# Patient Record
Sex: Female | Born: 1935 | Race: White | Hispanic: No | Smoking: Never smoker
Health system: Southern US, Community
[De-identification: ages and names within clinical notes are randomized; demographics above are authoritative.]

## PROBLEM LIST (undated history)

## (undated) DIAGNOSIS — I10 Essential (primary) hypertension: Secondary | ICD-10-CM

## (undated) DIAGNOSIS — I639 Cerebral infarction, unspecified: Secondary | ICD-10-CM

## (undated) DIAGNOSIS — C541 Malignant neoplasm of endometrium: Secondary | ICD-10-CM

## (undated) DIAGNOSIS — E785 Hyperlipidemia, unspecified: Secondary | ICD-10-CM

## (undated) DIAGNOSIS — G629 Polyneuropathy, unspecified: Secondary | ICD-10-CM

## (undated) DIAGNOSIS — E119 Type 2 diabetes mellitus without complications: Secondary | ICD-10-CM

## (undated) DIAGNOSIS — T82897A Other specified complication of cardiac prosthetic devices, implants and grafts, initial encounter: Secondary | ICD-10-CM

## (undated) DIAGNOSIS — H547 Unspecified visual loss: Secondary | ICD-10-CM

## (undated) HISTORY — PX: OTHER SURGICAL HISTORY: SHX169

## (undated) HISTORY — DX: Hyperlipidemia, unspecified: E78.5

## (undated) HISTORY — DX: Malignant neoplasm of endometrium: C54.1

## (undated) HISTORY — DX: Cerebral infarction, unspecified: I63.9

## (undated) HISTORY — DX: Polyneuropathy, unspecified: G62.9

## (undated) HISTORY — DX: Essential (primary) hypertension: I10

## (undated) HISTORY — DX: Unspecified visual loss: H54.7

## (undated) HISTORY — DX: Type 2 diabetes mellitus without complications: E11.9

## (undated) HISTORY — DX: Other specified complication of cardiac prosthetic devices, implants and grafts, initial encounter: T82.897A

---

## 1999-07-20 ENCOUNTER — Other Ambulatory Visit: Admission: RE | Admit: 1999-07-20 | Discharge: 1999-07-20 | Payer: Self-pay | Admitting: Family Medicine

## 2001-05-14 ENCOUNTER — Emergency Department (HOSPITAL_COMMUNITY): Admission: EM | Admit: 2001-05-14 | Discharge: 2001-05-14 | Payer: Self-pay | Admitting: *Deleted

## 2006-08-22 DIAGNOSIS — I639 Cerebral infarction, unspecified: Secondary | ICD-10-CM

## 2006-08-22 HISTORY — DX: Cerebral infarction, unspecified: I63.9

## 2007-06-22 ENCOUNTER — Encounter: Payer: Self-pay | Admitting: Internal Medicine

## 2007-08-13 ENCOUNTER — Ambulatory Visit (HOSPITAL_COMMUNITY): Admission: RE | Admit: 2007-08-13 | Discharge: 2007-08-13 | Payer: Self-pay | Admitting: Interventional Radiology

## 2008-01-03 ENCOUNTER — Ambulatory Visit: Payer: Self-pay | Admitting: Family Medicine

## 2008-01-03 ENCOUNTER — Inpatient Hospital Stay (HOSPITAL_COMMUNITY): Admission: EM | Admit: 2008-01-03 | Discharge: 2008-01-06 | Payer: Self-pay | Admitting: Emergency Medicine

## 2008-01-04 ENCOUNTER — Encounter (INDEPENDENT_AMBULATORY_CARE_PROVIDER_SITE_OTHER): Payer: Self-pay | Admitting: Internal Medicine

## 2008-02-15 ENCOUNTER — Encounter: Payer: Self-pay | Admitting: *Deleted

## 2010-03-05 ENCOUNTER — Inpatient Hospital Stay (HOSPITAL_COMMUNITY): Admission: EM | Admit: 2010-03-05 | Discharge: 2010-03-09 | Payer: Self-pay | Admitting: Emergency Medicine

## 2010-04-26 IMAGING — CT CT HEAD W/O CM
1 series · 16 of 30 positions shown, 20 images · non-contrast
Comparison: 08/13/2007

CLINICAL DATA: Syncope

CT HEAD WITHOUT CONTRAST
TECHNIQUE: Contiguous axial images were obtained from the base of
the skull through the vertex without contrast.

[Series 2: head routine 4.8 h37s · axial · 0.43mm/px · z∈[-125,+10]mm · 16 of 30 slices shown, 20 images]
[im 2/30  brain]
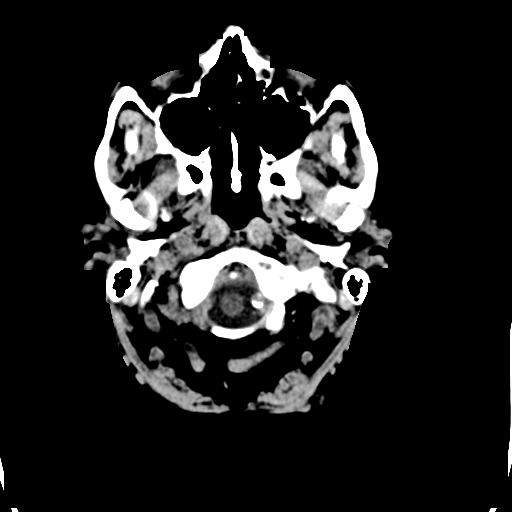
[im 2/30  bone]
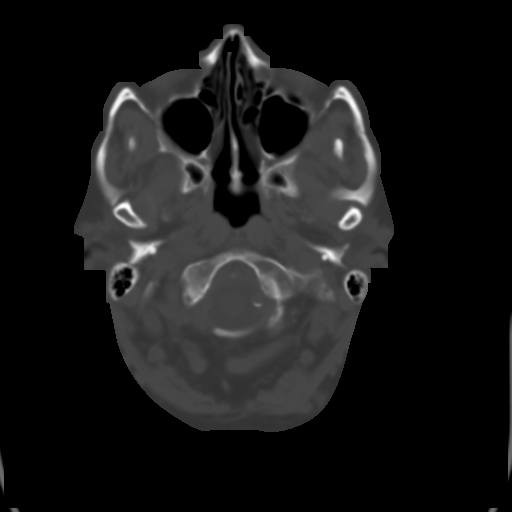
[im 4/30  brain]
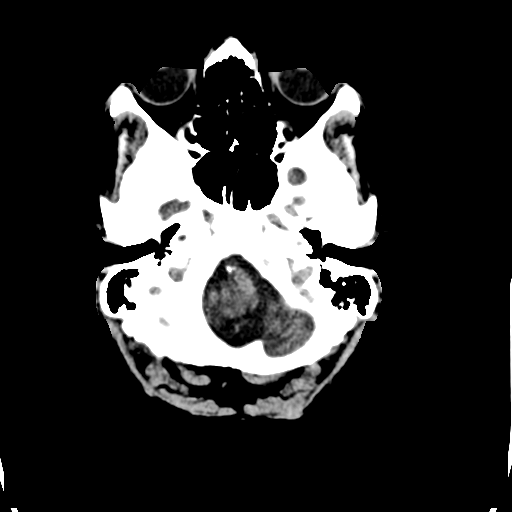
[im 6/30  brain]
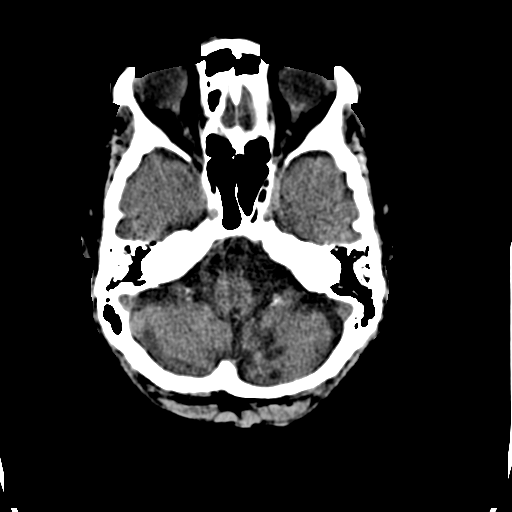
[im 8/30  brain]
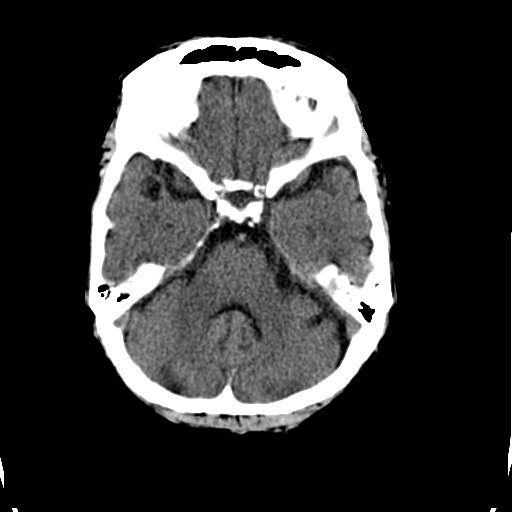
[im 9/30  brain]
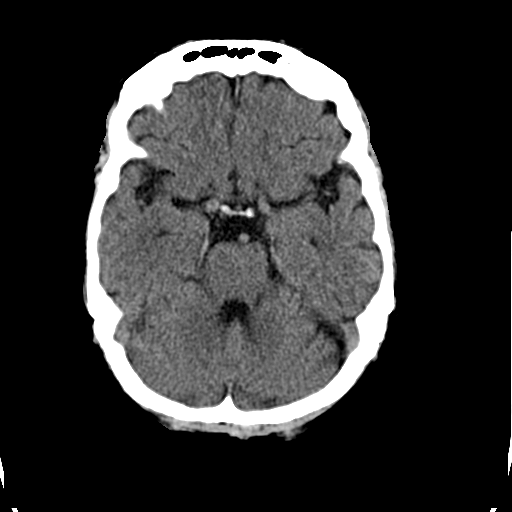
[im 9/30  bone]
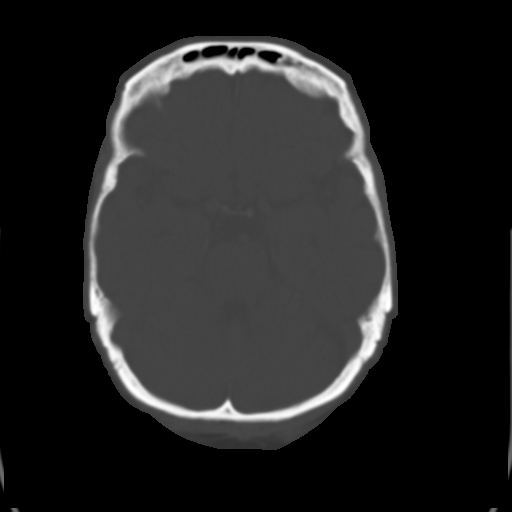
[im 11/30  brain]
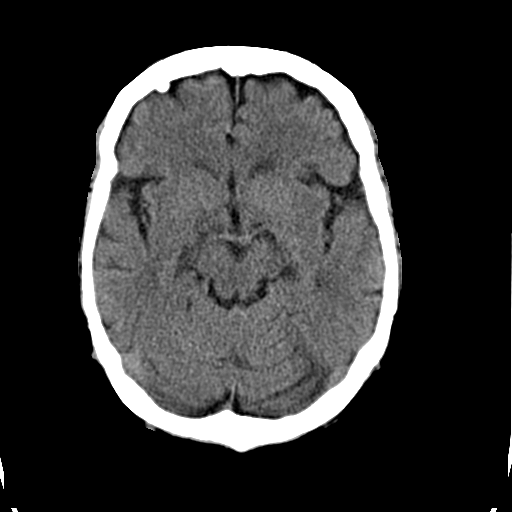
[im 13/30  brain]
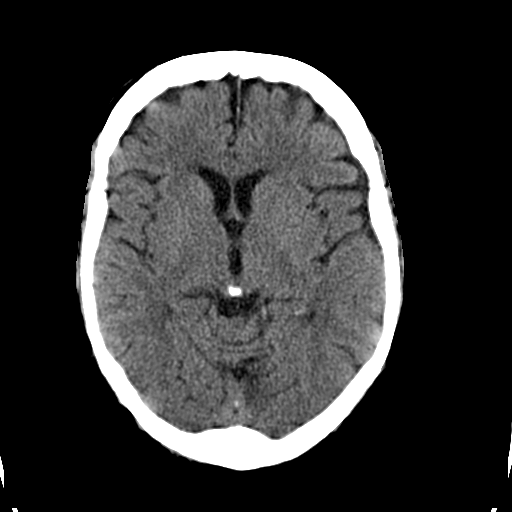
[im 15/30  brain]
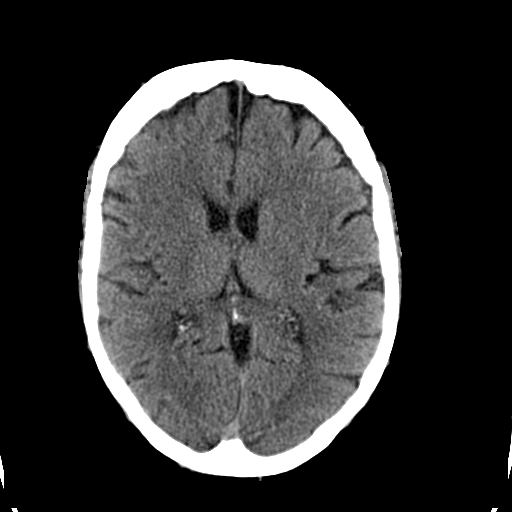
[im 16/30  brain]
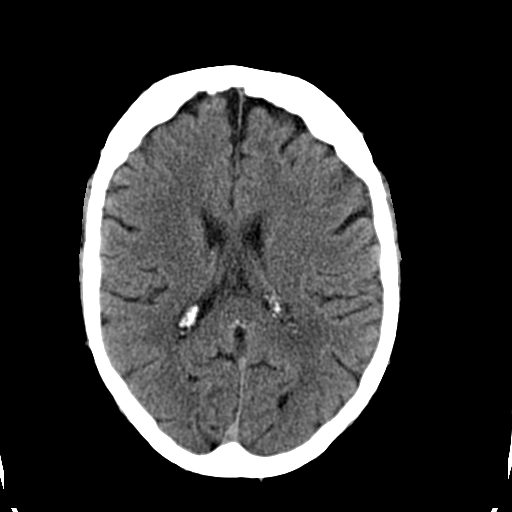
[im 16/30  bone]
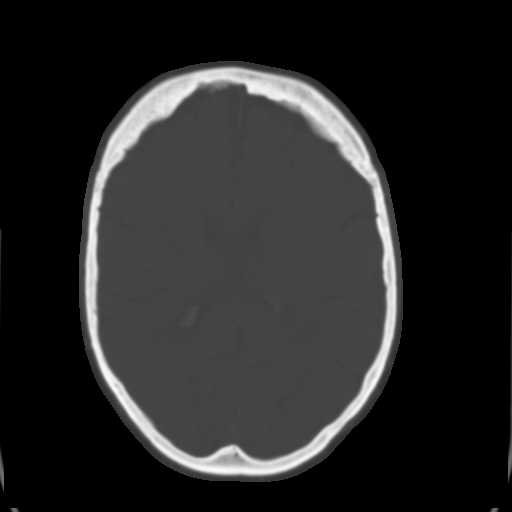
[im 18/30  brain]
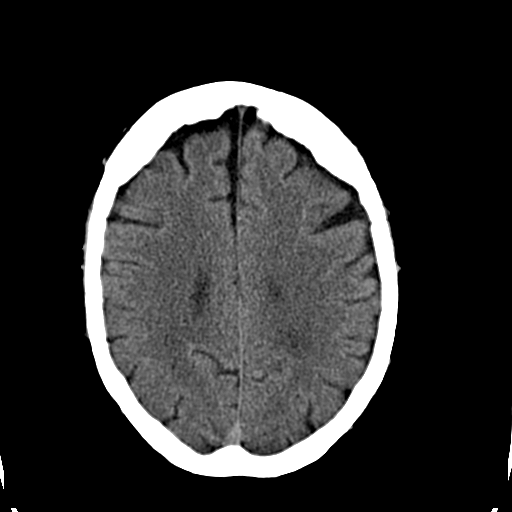
[im 20/30  brain]
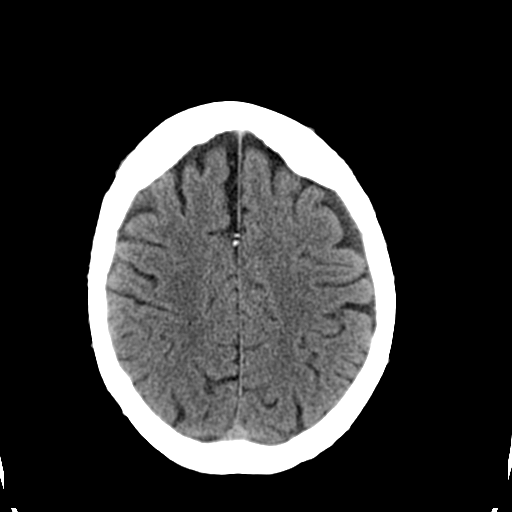
[im 22/30  brain]
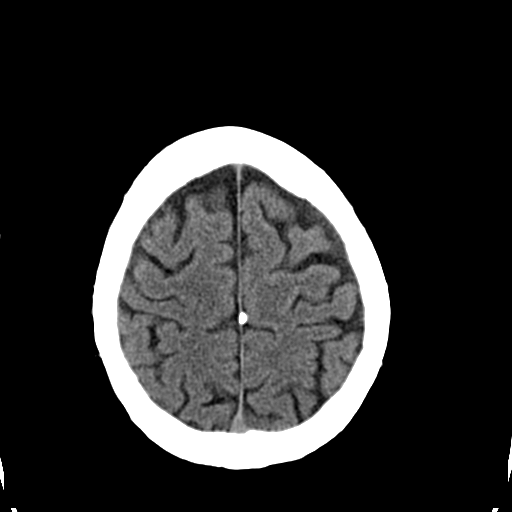
[im 23/30  brain]
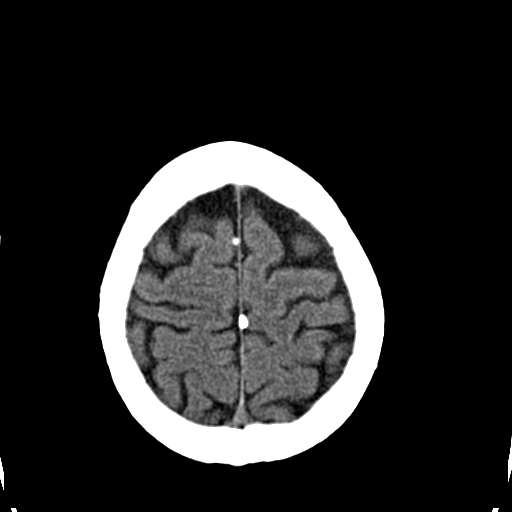
[im 23/30  bone]
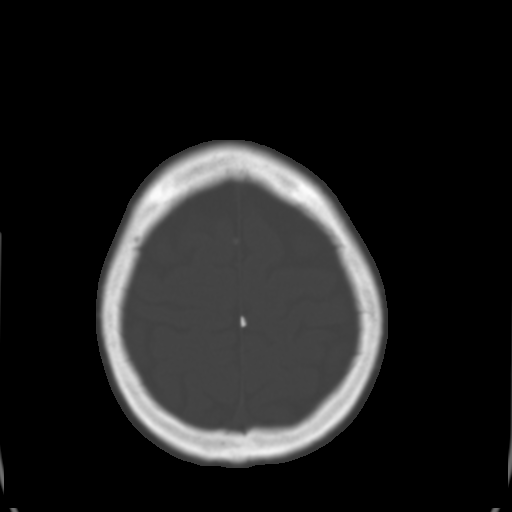
[im 25/30  brain]
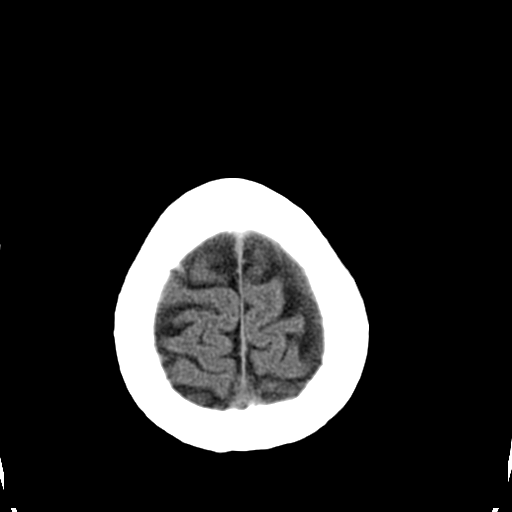
[im 27/30  brain]
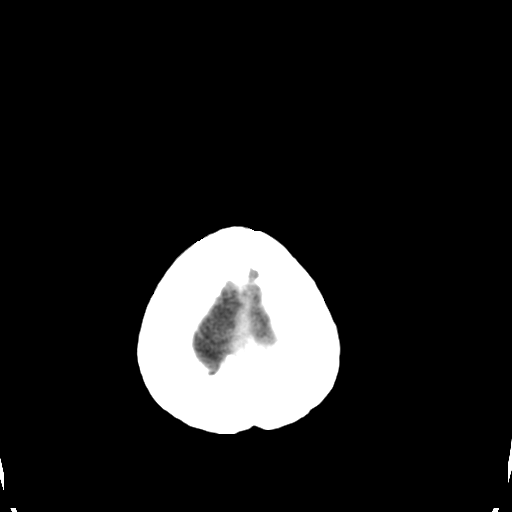
[im 29/30  brain]
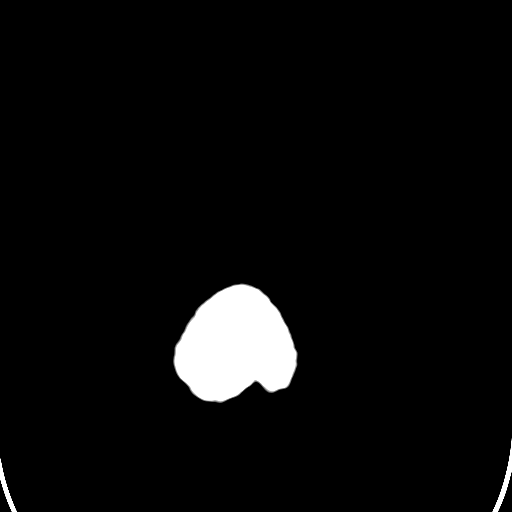

[16 of 30 positions shown; findings below may reference images not displayed]

FINDINGS: The ventricles are normal in size.  There is no acute
infarct or mass lesion.  Mild chronic ischemic changes are present
in the white matter bilaterally.  There is no acute hemorrhage.
There is atherosclerotic calcification in the vertebral arteries
and carotid arteries bilaterally.
IMPRESSION: No acute intracranial abnormality.

## 2010-09-12 ENCOUNTER — Encounter: Payer: Self-pay | Admitting: Interventional Radiology

## 2010-11-06 LAB — GLUCOSE, CAPILLARY
Glucose-Capillary: 102 mg/dL — ABNORMAL HIGH (ref 70–99)
Glucose-Capillary: 102 mg/dL — ABNORMAL HIGH (ref 70–99)
Glucose-Capillary: 107 mg/dL — ABNORMAL HIGH (ref 70–99)
Glucose-Capillary: 109 mg/dL — ABNORMAL HIGH (ref 70–99)
Glucose-Capillary: 110 mg/dL — ABNORMAL HIGH (ref 70–99)
Glucose-Capillary: 116 mg/dL — ABNORMAL HIGH (ref 70–99)
Glucose-Capillary: 118 mg/dL — ABNORMAL HIGH (ref 70–99)
Glucose-Capillary: 119 mg/dL — ABNORMAL HIGH (ref 70–99)
Glucose-Capillary: 126 mg/dL — ABNORMAL HIGH (ref 70–99)
Glucose-Capillary: 129 mg/dL — ABNORMAL HIGH (ref 70–99)
Glucose-Capillary: 160 mg/dL — ABNORMAL HIGH (ref 70–99)
Glucose-Capillary: 170 mg/dL — ABNORMAL HIGH (ref 70–99)

## 2010-11-06 LAB — CARDIAC PANEL(CRET KIN+CKTOT+MB+TROPI)
CK, MB: 1.6 ng/mL (ref 0.3–4.0)
CK, MB: 1.8 ng/mL (ref 0.3–4.0)
Relative Index: INVALID (ref 0.0–2.5)
Relative Index: INVALID (ref 0.0–2.5)
Total CK: 66 U/L (ref 7–177)
Troponin I: 0.01 ng/mL (ref 0.00–0.06)

## 2010-11-06 LAB — BASIC METABOLIC PANEL
BUN: 16 mg/dL (ref 6–23)
Chloride: 103 mEq/L (ref 96–112)
Creatinine, Ser: 0.81 mg/dL (ref 0.4–1.2)
Potassium: 3.8 mEq/L (ref 3.5–5.1)

## 2010-11-07 LAB — HEMOGLOBIN A1C
Hgb A1c MFr Bld: 6.3 % — ABNORMAL HIGH (ref <5.7)
Mean Plasma Glucose: 134 mg/dL — ABNORMAL HIGH (ref <117)

## 2010-11-07 LAB — CK TOTAL AND CKMB (NOT AT ARMC): Relative Index: INVALID (ref 0.0–2.5)

## 2010-11-07 LAB — DIFFERENTIAL
Eosinophils Relative: 0 % (ref 0–5)
Lymphocytes Relative: 18 % (ref 12–46)
Lymphs Abs: 2.2 10*3/uL (ref 0.7–4.0)
Monocytes Absolute: 0.7 10*3/uL (ref 0.1–1.0)
Monocytes Relative: 6 % (ref 3–12)

## 2010-11-07 LAB — CBC
HCT: 42.7 % (ref 36.0–46.0)
Hemoglobin: 14.5 g/dL (ref 12.0–15.0)
MCH: 32 pg (ref 26.0–34.0)
MCHC: 34 g/dL (ref 30.0–36.0)
MCV: 94.2 fL (ref 78.0–100.0)
Platelets: 333 10*3/uL (ref 150–400)
WBC: 12.5 10*3/uL — ABNORMAL HIGH (ref 4.0–10.5)

## 2010-11-07 LAB — URINALYSIS, ROUTINE W REFLEX MICROSCOPIC
Glucose, UA: 100 mg/dL — AB
Hgb urine dipstick: NEGATIVE
Protein, ur: NEGATIVE mg/dL
pH: 7 (ref 5.0–8.0)

## 2010-11-07 LAB — POCT CARDIAC MARKERS
CKMB, poc: 1.5 ng/mL (ref 1.0–8.0)
Myoglobin, poc: 60 ng/mL (ref 12–200)
Troponin i, poc: <0.05 ng/mL (ref 0.00–0.09)

## 2010-11-07 LAB — COMPREHENSIVE METABOLIC PANEL
AST: 22 U/L (ref 0–37)
Albumin: 4.2 g/dL (ref 3.5–5.2)
Calcium: 9 mg/dL (ref 8.4–10.5)
Creatinine, Ser: 0.61 mg/dL (ref 0.4–1.2)
GFR calc Af Amer: >60 mL/min (ref >60)
Sodium: 138 mEq/L (ref 135–145)

## 2010-11-07 LAB — TROPONIN I: Troponin I: 0.02 ng/mL (ref 0.00–0.06)

## 2010-11-07 LAB — TSH: TSH: 1.726 u[IU]/mL (ref 0.350–4.500)

## 2011-01-04 NOTE — H&P (Signed)
[name], [name] NO.:  [id number]   MEDICAL RECORD NO.:  [id number]          PATIENT TYPE:  INP   LOCATION:  4707                         FACILITY:  [facility]   PHYSICIAN:  [name] Kim, M.D.DATE OF BIRTH:  March 06, 1929   DATE OF ADMISSION:  01/03/2008  DATE OF DISCHARGE:                              HISTORY & PHYSICAL   PRIMARY CARE PHYSICIAN:  [name] Kim in [place].   CHIEF COMPLAINT:  Syncope.   HISTORY OF PRESENT ILLNESS:  The patient is a 77 year old female who was  brought to the emergency department by EMS after a syncopal episode.  The patient states (history corroborated by grandson who was with her)  that she ate taco bar earlier this afternoon, then went to [employer].  She was there for 45 minutes. While at the checkout line, she felt  nauseous and was sweating, so she went to sit down and then she passed  out.  Of note, her eyes were open, and per witnesses, she had some  tremors for about 15 to 20 minutes.  She appeared cold and pale.  She  states she had some dyspnea but denies chest pain or palpitations.  No  loss of bowel or bladder function.  She had a stress test in December  2008 at [facility], which was normal.  She  had previous strokes noted on CT scan done in 2008 in the left inferior  cerebellar region, and she was also diagnosed with bilateral vertebral  artery stenosis in December 2008.  Full records and history are not in E-  chart as patient's PCP is in [place].  She also had an angiogram in  December 2008, which showed complete right vertebral artery occlusion,  80-85% narrowing of left vertebral basilar junction and 50% right ICA  stenosis.  She has not seen [name] Kim since this visit though she  was told to follow up in 3 months.   PAST MEDICAL HISTORY:  1. Borderline diabetes mellitus.  2. Hypertension.  3. CVA.  4. Hyperlipidemia.  5. Endometrial cancer, status post TAH-BSO in 2006.  6. Vertebral artery stenosis as noted above.   ALLERGIES:  No known drug allergies.   MEDICATIONS:  1. Altace 10 mg daily.  2. Omeprazole 20 mg daily.  3. Folcaps 2.2 mg daily.  4. Hydrochlorothiazide 25 mg daily.  5. Glimepiride 2 mg morning.  6. Metoprolol 50 mg b.i.d.  7. Norvasc 10 mg daily.  8. Lipitor 10 mg daily.  9. TriCor 145 mg daily.  10.Plavix 75 mg daily.  11.Aspirin 81 mg daily.  12.Tylenol 650 mg q.6 h.  13.Valium 2 mg nightly.  14.Lexapro 10 mg daily.   SOCIAL HISTORY:  She lives with her son and daughter-in-law.  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  She is widowed.  She denies any tobacco, alcohol, or drug use currently or remote.   FAMILY HISTORY:  Mother is deceased from cancer.  Father is deceased at  age 54.  He had drowned.   REVIEW OF SYSTEMS:  Positive for some  diaphoresis, nausea, fatigue,  headache, cough since October, right leg burning, and inability to see  out of her right eye, this is not new.  Denies fevers, chills, chest  pain, edema, orthopnea, vomiting, diarrhea, bright red blood per rectum,  melena, abdominal pain, dysuria, hematuria, rash, visual changes,  otherwise, dysarthria, weakness, and numbness.   PHYSICAL EXAMINATION:  VITAL SIGNS:  Temperature 96.8, pulse 60,  respirations 18, blood pressure 158/74, and pulse oximetry 95% on room  air.  GENERAL:  No acute distress, pleasant.  HEENT:  Atraumatic and normocephalic.  Extraocular movements intact.  Pupils equal, round, and reactive to light.  No nasal discharge.  Tympanic membranes are glossy with a good light reflex and canals are  clear.  Mucous membranes are moist without pharyngeal erythema or  exudate.  NECK:  No lymphadenopathy, JVD, thyromegaly, or bruits.  CARDIOVASCULAR:  Regular rate and rhythm.  No murmurs, rubs, or gallops.  LUNGS:  Clear to auscultation bilaterally.  No wheezes, rales, or  rhonchi.  ABDOMEN:  Soft, nontender, and  nondistended.  No hepatosplenomegaly.  Bowel sounds positive.  EXTREMITIES:  No cyanosis or edema, 2+ DP pulses.  NEUROLOGIC:  Alert and oriented x3.  Cranial nerves II-XII are grossly  intact except for decreased vision in right eye and some difficulty  hearing bilaterally.  Strength is 5/5 in all extremities.  Sensation is  intact to light touch bilaterally.  Muscle stretch reflexes are equal  bilaterally.  Mild dysmetria finger-to-nose bilaterally.  No  dysdiadochokinesis and heel-to-shin is normal bilaterally.   LABS AND STUDIES:  Hemoglobin 12.7, platelets 453, white blood cell  count 11.0 with 70% neutrophils.  Sodium 139, potassium 4.6, chloride  100, bicarb 25, BUN 14, creatinine 0.71, glucose 140, calcium 9.4,  protein 7.5, albumin 4.0, AST 837, INR 1.0, and PT 13.7.  Urinalysis;  specific gravity is 1.015, nitrite negative, small leukocyte esterase.  Urine microscopy showed few epithelial cells and many bacteria.  Myoglobin 56.2, CK-MB 1.5, troponin-I less than 0.05.  Head CT showed no  acute abnormality.  EKG is normal sinus with the left axis.  Poor R-wave  progression otherwise no concordant ST-T or Q-wave changes.  PR and QT  intervals are normal.   ASSESSMENT/PLAN:  This is a 77 year old female with:  1. Syncope, vasovagal versus carotid stenosis versus transient      ischemic attack versus arrhythmia/cardiac event versus valvular      dysfunction versus pulmonary embolus.  Given her known vertebral      artery disease, this is possible but typically not classic      symptomatology for this.  We will check carotid Dopplers to assess      for increased carotid disease since angiogram in July 23, 1997.      We will place on telemetry to monitor for any arrhythmias.  We will      check a 2D echo to assess structure and valvular function of the      heart.  We will check a TSH.  We will check an EKG in the morning.      We will check a D-dimer given she is low risk for  pulmonary embolus      but had dyspnea.  She is not hypoxic or tachycardiac.  Vasovagal is      also possible given her symptoms of nausea and diaphoresis, which      was postprandial.  We will have neuro checks every 4 hours along  with fall precautions.  Head CT is negative, but transient ischemic      attack is possible.  We will consider EEG, but the patient is not      postictal and had very quick recovery so I doubt that this was a      seizure.  2. Hypertension.  The patient is on several medications for      hypertension including Altace, hydrochlorothiazide, metoprolol, and      Norvasc.  We will hold the patient's beta blocker as this may      precipitate falls in patient.  3. Hyperlipidemia.  We will check a fasting lipid panel and TSH.  We      will continue the patient's Lipitor and TriCor per her home doses.  4. Cough.  She said this since October and now it is dry.  She likely      has allergic symptoms.  We will check a chest x-ray.  5. Diabetes mellitus.  We will check an A1c.  We will place on      moderate sliding scale insulin q.a.c.  We will check CBG q.a.c. and      nightly.  We will place her on a carbohydrate modified diet.  6. Leukocytosis.  We will check a urine culture and monitor for      fevers.  If she has any symptoms of fever, we will start      antibiotics but would not be pressed to start antibiotics for      asymptomatic bacteriuria.  7. History of cerebrovascular accident .  We will consider calling Dr.      Corliss Kim to reassess her vertebral stenosis.  We will continue her      on aspirin and Plavix as at home.  We will discontinue the      patient's omeprazole as this may decrease effectiveness of Plavix.  8. Prophylaxis, ambulation.      Norton Blizzard, M.D.  Electronically Signed      [name] Kim, M.D.  Electronically Signed    SH/MEDQ  D:  01/04/2008  T:  01/04/2008  Job:  [id number]

## 2011-01-04 NOTE — Consult Note (Signed)
[name] Kim, [name] Kim                ACCOUNT NO.:  [id number]   MEDICAL RECORD NO.:  [id number]          PATIENT TYPE:  OUT   LOCATION:  XRAY                         FACILITY:  [facility]   PHYSICIAN:  Sanjeev K. Deveshwar, M.D.DATE OF BIRTH:  September 04, 1928   DATE OF CONSULTATION:  02/15/2008  DATE OF DISCHARGE:  01/06/2008                                 CONSULTATION   CHIEF COMPLAINT:  Cerebrovascular disease.   HISTORY OF PRESENT ILLNESS:  This is a very pleasant 77 year old female  who was recently admitted to [facility] on Jan 03, 2008 to Jan 06, 2008 after she had a syncopal episode while in [employer].  The  etiology of the episode was unclear.  The patient had previously  undergone a cerebral angiogram performed on August 13, 2007 by Dr.  Corliss Skains to evaluate sudden onset of blindness in her right eye.  She  was found to have an occluded right vertebral artery with an 80%-85%  left vertebrobasilar junction.  She was also noted to have a 50% right  internal carotid artery stenosis.  At that time, the patient's arteries  were noted to be very tortuous and PTA stenting was not performed.  The  patient was felt to be asymptomatic and continued aspirin and Plavix  therapy was recommended with followup in 3 months.  The patient  apparently was lost to followup until she was admitted to [facility] recently.  After discharge, she was told to contact Dr.  Corliss Skains for a followup visit.  She presents today accompanied by her  son for that visit.  The patient reports that she has had no further  syncopal episodes.  She has had some generalized weakness.  She remains  weak on her right side from a CVA in September 2008.   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  She has also had a negative  stress nuclear stress test in November 2008.  Her ejection fraction was  75% at that time.   PAST SURGICAL HISTORY:  The patient is status post tonsillectomy, status  post appendectomy, status post total abdominal hysterectomy with BSO in  2006.   ALLERGIES:  CONTRAST DYE causes nausea and vomiting.   CURRENT MEDICATIONS:  The patient did not bring a medication list to her  visit today.  She believes that her medications have not significantly  changed from her discharge from [facility] in May.  At that  time, her medications included:  1. Altace 10 mg daily.  2. Folcaps 2.2 mg daily.  3. Hydrochlorothiazide 25 mg daily.  4. Amaryl 2 mg daily.  5. Norvasc 10 mg daily.  6. Tricor 145 mg daily.  7. Plavix 75 mg daily.  8. Aspirin 81 mg daily.  9. Valium 2 mg at bedtime p.r.n.  10.Lipitor 20 mg daily.  11.Tylenol 650 mg every 6 hours  as needed.  12.Lexapro 10 mg daily.   Apparently, she had been on a proton pump inhibitor that was  discontinued over concerns for a potential interaction with Plavix.   SOCIAL HISTORY:  The patient is widowed.  She has one son who is living.  She has one son who died from an MI.  The patient lives in [place] with  her son and daughter-in-law.  She does not smoke or use alcohol.  She  was a housewife.   FAMILY HISTORY:  Her mother died at age 51 from cancer.  Her father died  from drowning at age 20.  She has 3 brothers who are deceased.  She has  4 sisters alive and well.   IMPRESSION AND PLAN:  As noted, this patient returns today accompanied  by her son to be seen in followup by Dr. Corliss Skains.  She had undergone a  cerebral angiogram in December 2008 and then was apparently lost to  followup until she was readmitted to [facility] in May with a  syncopal episode of uncertain etiology.  Her previous angiogram had  shown an occluded right vertebral  artery with an 80%-85% stenosis of the  left vertebrobasilar junction as well as a 50% right internal carotid  artery stenosis.  The patient has remained on Plavix and aspirin and has  been asymptomatic.  Other than her recent syncopal episode, she did have  a stroke in September 2008 and has residual weakness as a result of her  stroke.  She is able to ambulate household distances with a quad cane.  She occasionally has episodes where she feels weak and has to sit down  to regain her strength but then she is able to get up and go again.   The etiology of her syncopal episode in May 2009 still remains unclear.  Dr. Corliss Skains did review the results of her previous MRI, MRA as well as  her cerebral angiogram.  He did not feel strongly that an intervention  should be re-attempted at this time.  He did recommend repeating an MRI,  MRA in 6 months.  The patient was told to call in the interim if she had  any new neurologic-type symptoms.  Greater than 25 minutes were spent on  this followup visit.      Delton See, P.A.    ______________________________  Grandville Silos. Corliss Skains, M.D.    DR/MEDQ  D:  02/15/2008  T:  02/16/2008  Job:  [id number]   cc:   Riley Lam M.D. Charlean Sanfilippo, M.D.

## 2011-01-04 NOTE — Discharge Summary (Signed)
[name] Kim, [name] Kim NO.:  [id number]   MEDICAL RECORD NO.:  [id number]          PATIENT TYPE:  INP   LOCATION:  4707                         FACILITY:  [facility]   PHYSICIAN:  Pearlean Brownie, M.D.DATE OF BIRTH:  1929-06-17   DATE OF ADMISSION:  01/03/2008  DATE OF DISCHARGE:  01/06/2008                               DISCHARGE SUMMARY   DISCHARGE DIAGNOSES:  1. Idiopathic syncope.  2. History of peripheral vascular disease with significant vertebral      blockages and a 50% right ICA in December 2008.  3. Hypertension.  4. Diabetes mellitus.  5. Questionable mild aortic stenosis.  6. Depression/anxiety.  7. History of endometrial cancer status post total abdominal      hysterectomy and bilateral salpingo-oophorectomy in 2006.  8. History of hyperlipidemia.   DISCHARGE MEDICATIONS:  1. Altace 10 mg daily.  2. Folcaps 2.2 mg daily.  3. Hydrochlorothiazide 25 mg daily.  4. Amaryl 2 mg daily.  5. Norvasc 10 mg daily.  6. Tricor 145 mg daily.  7. Plavix 75 mg daily.  8. Aspirin 81 mg daily.  9. Valium 2 mg before bedtime time as needed.  10.Lipitor 20 mg daily.  11.Tylenol 650 mg every 6 hours as needed.  12.Lexapro 10 mg daily.   PROCEDURES:  1. Chest x-ray on Jan 03, 2008, showed cardiomegaly but no acute      abnormalities.  2. Head CT on Jan 03, 2008, showed no acute intracranial abnormality,      but obvious atherosclerotic calcification of the vertebral arteries      and carotids bilaterally with chronic ischemic changes in the white      matter.  3. Echocardiogram:  Systolic function vigorous.  No regional wall      abnormalities with focal basal septal hypertrophy with evidence for      dynamic left ventricular outflow tract obstruction at rest.      Consider TEE as we cannot exclude a mild aortic stenosis, mild      mitral regurgitation, mild left atrial dilation, hyperdynamic right      ventricular systolic function with mild right  ventricular      hypertrophy.   PERTINENT LABORATORY RESULTS:  Creatinine at discharge was 0.67.  Hemoglobin A1c was 5.9.  TSH was 2.078.  Cardiac panels were negative.  D-dimer was negative.  Lipid profile included HDL 40 and LDL of 90.  Blood count at admission was 12.7.  Her hemoglobin was 12.2 at  discharge.   HOSPITAL COURSE:  A 77 year old white female admitted after a syncopal  event.  There was no obvious cause for this at the time in the emergency  department.  Echocardiogram was performed, which showed possible aortic  stenosis, otherwise no enlarged abnormalities.  See procedures above.  The patient did well without any repeat events throughout her hospital  stay essentially.  She was ambulating at the time of discharge.  Her  telemetry has been cleared.   DIAGNOSES:  1. Syncope:  Idiopathic at this time.  Possibilities include      contribution from her Valium, although she  was stable at hospital      on her home dose without problems.  Peripheral vascular disease is      not a possibility, although syncope is an uncommon complaint, her      presenting complaint for this issue.  2. Peripheral artery disease:  The patient will follow up with Dr.      Corliss Skains to evaluate for consideration of change of plan given her      change in symptoms.  She has had fairly recent intracranial imaging      back in December.  So, I am not sure if she would necessarily be      due for further angiogram.  She is also currently on aspirin and      Plavix, and I wonder if the Plavix alone could give her the benefit      she needs since there is increased risk of bleeding with the      combination therapy and to my understanding no added benefit from      the aspirin.  3. Diabetes mellitus:  She is stable, continue.  4. Abnormalities on echocardiogram:  The patient will follow up with      Dr. Lynnea Ferrier at [facility], although per the      nurse, there will no specific  intervention required for these      issues.  They will decide on the need for TEE.  5. I would consider Doppler ultrasounds of the carotids in the      outpatient setting.   DISCHARGE DIET:  Low sodium.   DISCHARGE ACTIVITY:  Carefully increase activity as tolerated.   FOLLOWUP:  The patient will call to see Dr. Tomasa Blase, Dr. Corliss Skains, and  Dr. Lynnea Ferrier individually so that she can be seen by each of these  physicians in the next 1-2 weeks.  Of note, Prilosec was discontinued  secondary to its potential interaction with Plavix and the metoprolol  was discontinued as it could potentially have contributed to her  syncope.      Angeline Slim, M.D.  Electronically Signed      Pearlean Brownie, M.D.  Electronically Signed    AL/MEDQ  D:  01/06/2008  T:  01/06/2008  Job:  [id number]   cc:   Corliss Skains, MD  Tomasa Blase, MD  Ritta Slot, MD

## 2011-01-28 ENCOUNTER — Emergency Department (HOSPITAL_COMMUNITY): Payer: Medicare Other

## 2011-01-28 ENCOUNTER — Inpatient Hospital Stay (HOSPITAL_COMMUNITY)
Admission: EM | Admit: 2011-01-28 | Discharge: 2011-01-30 | DRG: 093 | Disposition: A | Discharge status: Home Health | Payer: Medicare Other | Attending: Emergency Medicine | Admitting: Emergency Medicine

## 2011-01-28 DIAGNOSIS — R279 Unspecified lack of coordination: Principal | ICD-10-CM | POA: Diagnosis present

## 2011-01-28 DIAGNOSIS — G609 Hereditary and idiopathic neuropathy, unspecified: Secondary | ICD-10-CM | POA: Diagnosis present

## 2011-01-28 DIAGNOSIS — E119 Type 2 diabetes mellitus without complications: Secondary | ICD-10-CM | POA: Diagnosis present

## 2011-01-28 DIAGNOSIS — Z7902 Long term (current) use of antithrombotics/antiplatelets: Secondary | ICD-10-CM

## 2011-01-28 DIAGNOSIS — I10 Essential (primary) hypertension: Secondary | ICD-10-CM | POA: Diagnosis present

## 2011-01-28 DIAGNOSIS — H5440 Blindness, one eye, unspecified eye: Secondary | ICD-10-CM | POA: Diagnosis present

## 2011-01-28 DIAGNOSIS — J029 Acute pharyngitis, unspecified: Secondary | ICD-10-CM | POA: Diagnosis present

## 2011-01-28 DIAGNOSIS — Z8673 Personal history of transient ischemic attack (TIA), and cerebral infarction without residual deficits: Secondary | ICD-10-CM

## 2011-01-28 DIAGNOSIS — Z7982 Long term (current) use of aspirin: Secondary | ICD-10-CM

## 2011-01-28 DIAGNOSIS — I6509 Occlusion and stenosis of unspecified vertebral artery: Secondary | ICD-10-CM | POA: Diagnosis present

## 2011-01-28 DIAGNOSIS — Z8542 Personal history of malignant neoplasm of other parts of uterus: Secondary | ICD-10-CM

## 2011-01-28 DIAGNOSIS — R110 Nausea: Secondary | ICD-10-CM | POA: Diagnosis present

## 2011-01-28 DIAGNOSIS — E785 Hyperlipidemia, unspecified: Secondary | ICD-10-CM | POA: Diagnosis present

## 2011-01-28 LAB — DIFFERENTIAL
Lymphs Abs: 2.8 10*3/uL (ref 0.7–4.0)
Monocytes Absolute: 1 10*3/uL (ref 0.1–1.0)
Monocytes Relative: 8 % (ref 3–12)
Neutro Abs: 7.7 10*3/uL (ref 1.7–7.7)
Neutrophils Relative %: 66 % (ref 43–77)

## 2011-01-28 LAB — CBC
HCT: 42.7 % (ref 36.0–46.0)
Hemoglobin: 15.5 g/dL — ABNORMAL HIGH (ref 12.0–15.0)
MCH: 32.2 pg (ref 26.0–34.0)
MCV: 88.6 fL (ref 78.0–100.0)
RBC: 4.82 MIL/uL (ref 3.87–5.11)

## 2011-01-28 LAB — COMPREHENSIVE METABOLIC PANEL
AST: 19 U/L (ref 0–37)
Albumin: 4.2 g/dL (ref 3.5–5.2)
BUN: 10 mg/dL (ref 6–23)
Creatinine, Ser: 0.5 mg/dL (ref 0.4–1.2)
Potassium: 3.3 mEq/L — ABNORMAL LOW (ref 3.5–5.1)
Total Protein: 7.7 g/dL (ref 6.0–8.3)

## 2011-01-28 LAB — URINALYSIS, ROUTINE W REFLEX MICROSCOPIC
Bilirubin Urine: NEGATIVE
Glucose, UA: NEGATIVE mg/dL
Hgb urine dipstick: NEGATIVE
Ketones, ur: NEGATIVE mg/dL
pH: 7.5 (ref 5.0–8.0)

## 2011-01-28 LAB — APTT: aPTT: 38 seconds — ABNORMAL HIGH (ref 24–37)

## 2011-01-28 LAB — CK TOTAL AND CKMB (NOT AT ARMC)
CK, MB: 1.7 ng/mL (ref 0.3–4.0)
Relative Index: INVALID (ref 0.0–2.5)

## 2011-01-28 LAB — PROTIME-INR
INR: 1.11 (ref 0.00–1.49)
Prothrombin Time: 14.5 seconds (ref 11.6–15.2)

## 2011-01-28 LAB — LIPASE, BLOOD: Lipase: 22 U/L (ref 11–59)

## 2011-01-28 LAB — TROPONIN I: Troponin I: <0.3 ng/mL (ref <0.30)

## 2011-01-29 ENCOUNTER — Inpatient Hospital Stay (HOSPITAL_COMMUNITY): Payer: Medicare Other

## 2011-01-29 LAB — GLUCOSE, CAPILLARY
Glucose-Capillary: 107 mg/dL — ABNORMAL HIGH (ref 70–99)
Glucose-Capillary: 139 mg/dL — ABNORMAL HIGH (ref 70–99)
Glucose-Capillary: 116 mg/dL — ABNORMAL HIGH (ref 70–99)

## 2011-01-29 LAB — CARDIAC PANEL(CRET KIN+CKTOT+MB+TROPI)
CK, MB: 1.8 ng/mL (ref 0.3–4.0)
Total CK: 56 U/L (ref 7–177)

## 2011-01-29 LAB — LIPID PANEL
Cholesterol: 189 mg/dL (ref 0–200)
Total CHOL/HDL Ratio: 3.3 RATIO
VLDL: 22 mg/dL (ref 0–40)

## 2011-01-29 LAB — HEMOGLOBIN A1C: Hgb A1c MFr Bld: 6.2 % — ABNORMAL HIGH (ref <5.7)

## 2011-01-30 LAB — GLUCOSE, CAPILLARY: Glucose-Capillary: 96 mg/dL (ref 70–99)

## 2011-02-03 NOTE — Discharge Summary (Signed)
[name] Kim, [name] Kim NO.:  [id number]  MEDICAL RECORD NO.:  [id number]  LOCATION:  3739                         FACILITY:  [facility]  PHYSICIAN:  Jeoffrey Massed, MD    DATE OF BIRTH:  06-Aug-1929  DATE OF ADMISSION:  01/28/2011 DATE OF DISCHARGE:                        DISCHARGE SUMMARY - REFERRING   PRIMARY CARE PRACTITIONER:  Mickey Farber, MD in [place].  PRIMARY DISCHARGE DIAGNOSIS:  Transient weakness and ataxia, now resolved.  SECONDARY DISCHARGE DIAGNOSES: 1. Hypertension. 2. Diabetes. 3. Dyslipidemia. 4. History of CVA in 2008. 5. History of endometrial cancer, status post hysterectomy. 6. History of chronically occluded right vertebral artery and possibly     chronic cerebellar syndrome from this. 7. History of blindness in her right eye. 8. Peripheral neuropathy. 9. Type 2 diabetes.  DISCHARGE MEDICATIONS: 1. Lisinopril 20 mg 1 tablet daily. 2. Crestor 10 mg 1 tablet at bedtime. 3. Artificial tears 2 drops in both eyes twice daily p.r.n. 4. Aspirin 81 mg 1 tablet daily. 5. Fish oil 100 mg 2 capsules daily. 6. Folplex 2.2 one tablet p.o. daily. 7. Amaryl 4 mg 1 tablet daily. 8. Omeprazole 20 mg 1 tablet daily. 9. Plavix 75 mg 1 tablet daily at bedtime. 10.Tylenol 650 mg 1 tablet p.o. q.6 h p.r.n. 11.Vicodin 5/500 one tablet p.o. q.6 h. p.r.n. 12.Vitamin D3 1000 units 2 tablets p.o. daily. 13.Neurontin 100 mg 1 tablet p.o. 3 times a day.  CONSULTATIONS:  Dr. Thad Ranger from Neurology.  BRIEF HISTORY OF PRESENT ILLNESS:  The patient is a 77 year old female with a known history of high-grade right vertebral artery stenosis and chronic cerebral syndrome, at baseline walks with a walker, presented with an unsteady gait, nausea, and issues with a blood pressure.  For further details, please see the history and physical that was dictated by Dr. Toniann Fail on admission.  PERTINENT RADIOLOGICAL STUDIES: 1. MRI of the brain showed small-vessel  disease.  Signal loss in the     right vertebral artery compatible with known stenosis.  Remote     encephalomalacia in the inferior left cerebellum.  Atrophy and     extensive white matter disease without significant change. 2. MRA of the brain shows small-vessel disease.  Krista Blue loss of the     right vertebral artery compatible with known stenosis.  Stable     ectasia of the proximal intracranial left vertebral artery. 3. Carotid duplex preliminary showed no significant ICA stenosis.  BRIEF HOSPITAL COURSE: 1. Ataxia.  Patient has had chronic intermittent problems with this     issue and at baseline walks with a walker.  Since this was much     worse than her usual baseline, she presented to the ED.  A CT of     the head done in the emergency room was negative for any     significant acute changes.  She was admitted to the telemetry unit     and subsequently Neurology was consulted.  MRI and MRA of the brain     was ordered which is noted as above, but in short did not show any     acute stroke.  Her known right vertebral stenosis was unchanged.  Interestingly, her symptoms resolved with supportive measures.     Today, she claims that she is back to her usual baseline.  She was     evaluated by Physical Therapy, and they have recommended home     health PT.  The carotid Doppler was negative for any significant     ICA stenosis.  A 2-D echocardiogram has been done, but it is     currently pending.  I had discussed this case personally with Dr.     Thad Ranger this morning, and she has actually cleared this patient     for discharge from her point of view as well.  She will follow up     with her primary care practitioner for further continued care. 2. Hypertension.  The patient has a known history of hypertension and     actually was on 4 medications prior to admission here.  Apparently,     there have been issues with her blood pressure being high a few     days prior to admission.   There are few issues with her blood     pressure being high prior to this admission, and apparently     medications have been added recently by her primary care     practitioner.  However, upon our evaluation throughout the hospital     course here, her blood pressure without any antihypertensive     medications has been in the 120s to 140s systolic.  At this point,     I think some of the dizziness and unsteady gait may have been     precipitated by decreased perfusion as well.  So, at this point, I     am only discharging her on lisinopril.  Her other medications which     include hydrochlorothiazide, amlodipine, and losartan have now been     discontinued.  I have instructed the patient and I have also spoken     with the patient's son over the phone and have asked them to follow     up with her primary care practitioner within 1 week upon discharge     for further continued optimization of her blood pressure     medications. 3. Dyslipidemia.  The patient has been started on Crestor. 4. Diabetes.  The patient is to continue Amaryl. 5. Peripheral neuropathy.  She is to continue taking Neurontin. 6. Known right vertebral artery stenosis.  The patient is to continue     taking aspirin, Plavix, and simvastatin.  DISPOSITION:  It is felt that the patient has met maximal benefit from inpatient hospital stay, her dizziness and ataxic gait have all resolved, and she is back to her baseline, has been discharged home with home health physical therapy.  FOLLOW-UP INSTRUCTIONS:  The patient will follow up with her primary care practitioner, Dr. Sedalia Muta within 1 week upon discharge.  She is to call and make an appointment.  Total time spent coordinating discharge equals 55 minutes.     Jeoffrey Massed, MD     SG/MEDQ  D:  01/30/2011  T:  01/30/2011  Job:  [id number]  cc:   Blane Ohara, MD  Electronically Signed by Jeoffrey Massed  on 02/03/2011 07:35:57 PM

## 2011-02-14 NOTE — H&P (Signed)
NAMECATHLIN, BUCHAN NO.:  [id number]  MEDICAL RECORD NO.:  [id number]  LOCATION:  [facility]                         FACILITY:  [facility]  PHYSICIAN:  Eduard Clos, MDDATE OF BIRTH:  1928/09/04  DATE OF ADMISSION:  01/28/2011 DATE OF DISCHARGE:                             HISTORY & PHYSICAL   PRIMARY CARE PHYSICIAN:  At Lowella Fairy, MD  CHIEF COMPLAINT:  Difficulty walking, nausea, and issues with blood pressure.  HISTORY OF PRESENTING ILLNESS:  A 77 year old female with known history of diabetes mellitus type 2, diabetic neuropathy, previous history of endometrial CA status post hysterectomy, history of CVA in the past with vertebral artery stenosis with chronic cerebellar syndrome, who usually uses a walker to walk but over the last 24 hours, she has been having increasing difficulty walking, getting ataxic which was confirmed in the ER.  The patient did not lose consciousness over the last 1 week.  The patient also had issues with blood pressure control which has running been very high.  In addition, the patient noticed some nausea today, has no headache.  Denies any abdominal pain, has some low back pain.  Denies any chest pain, shortness of breath, cough, or phlegm.  Denies fever or chills.  Has no vision in the right eye from a previous stroke.  In the left eye, the patient had no issues with visual symptoms.  The patient did not lose function of the upper or lower extremities.  In the ER, the patient had a CT head which is negative.  The patient will be admitted for further workup for ruling out any stroke and further workup on her dizziness.  In addition, the patient has complained of some sore throat like feeling over the last 2 days.  Denies any cough or phlegm.  Denies any dysuria, discharge, or diarrhea.  PAST MEDICAL HISTORY:  History of vertebral artery stenosis with chronic cerebellar syndrome, history of hypertension, history of  diabetes mellitus type 2, history of hyperlipidemia, history of endometrial CA, history of ataxia.  PAST SURGICAL HISTORY:  Hysterectomy for endometrial CA and also had appendectomy.  MEDICATIONS PRIOR TO ADMISSION:  Has to be verified includes aspirin, Folplex, Exforge, gabapentin, glimepiride, hydrocodone and acetaminophen, lisinopril, omeprazole.  ALLERGIES:  No known drug allergies.  FAMILY HISTORY:  Significant for cancer in mother, do not know exactly what was.  Father died at age 25 from drowning.  SOCIAL HISTORY:  The patient lives with her son.  Denies smoking cigarettes, drinking alcohol, or use any illegal drugs.  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  REVIEW OF SYSTEMS:  As per the history of presenting illness, nothing else is significant.  PHYSICAL EXAMINATION:  GENERAL:  The patient was examined at the bedside, not in acute distress. VITAL SIGNS:  Blood pressure 140/80, pulse 90 per minute, temperature 97.8, respirations 18 per minute, O2 sat 98%. HEENT:  The patient has poor vision on the right eye.  Left eye pupils are reactive.  There is no facial asymmetry.  Tongue is midline.  I do not see any erythema in the throat at this time.  No discharge from ears, eyes,  nose, or mouth. NECK:  There is no neck rigidity. CHEST:  Bilateral air entry present.  No rhonchi.  No crepitation. HEART:  S1 and S2 heard. ABDOMEN:  Soft, nontender.  Bowel sounds heard. CNS:  The patient is alert, awake, and oriented to time, place, and person.  Moves upper and lower extremities, 5/5.  There is no pronator drift or dysdiadochokinesia, but the patient does have difficulty walking when she is ataxic. EXTREMITIES:  Peripheral pulses felt.  No edema.  No acute ischemic changes, cyanosis, or clubbing.  LABS:  EKG has been ordered.  CT of the head without contrast shows no acute intracranial abnormality, mild cerebral atrophy, mild periventricular white matter  decreased attenuation, this is probably due to chronic small-vessel ischemic changes.  Chest x-ray shows no acute disease.  Acute abdominal series shows negative study.  CBC; WBC 11.6, hemoglobin is 15.5, hematocrit is 42.7,  platelets 350.  Complete metabolic panel; sodium 140, potassium 3.3, chloride 99, carbon dioxide 26, glucose 128, BUN 10, creatinine 0.5.  Total bili is 0.3, alkaline phosphatase 85, AST 19, ALT 20, total protein 7.7, albumin 4.2, calcium 9.5.  CK is 61, MB is 1.7, troponin less than 0.3.  UA is negative for nitrite, leukocyte esterase, wbc few, squamous cells few  ASSESSMENT: 1. Ataxia with previous history of cerebrovascular accident and     vertebral artery stenosis with chronic cerebellar syndrome.  At     this time, we have to rule out any cerebrovascular accident. 2. Nausea, unclear etiology. 3. Hypertension. 4. Sore throat. 5. History of diabetes mellitus type 2. 6. History of hypertension. 7. History of diabetic neuropathy.  PLAN: 1. At this time, we will admit the patient to telemetry. 2. For her ataxia with previous history of CVA and chronic cerebellar     syndrome, we will admit to rule out any CVA with MRI of the brain,     MRA of the brain, carotid Doppler, 2D echo.  The patient will be     place on neuro check. 3. The patient does complain of some sore throat.  We will place the     patient on Zithromax for a strep throat. 4. Nausea.  At this time, the patient's abdomen appears benign.  If     the nausea persists, then we need to get a CT of the abdomen and     pelvis.  At this time, we are going to check a lipase level and     further recommendations as condition evolves.     Eduard Clos, MD     ANK/MEDQ  D:  01/28/2011  T:  01/28/2011  Job:  [id number]  Electronically Signed by Midge Minium MD on 02/14/2011 62:13:08 AM

## 2011-02-26 NOTE — Consult Note (Signed)
NAMELILLION, ELBERT NO.:  [id number]  MEDICAL RECORD NO.:  [id number]  LOCATION:  3739                         FACILITY:  [facility]  PHYSICIAN:  Thana Farr, MD    DATE OF BIRTH:  [birth year]  DATE OF CONSULTATION:  01/29/2011 DATE OF DISCHARGE:                                CONSULTATION   CHIEF COMPLAINT:  Ataxia.  HISTORY OF PRESENT ILLNESS:  This is a 77 year old white female with a history of CVA in 2008 and occluded right vertebral artery.  Had the onset of blindness at that time in the right eye.  Over the last week, the patient has had fluctuations in her blood pressure, felt increasingly off balance and has mild nausea.  She presented to the hospital for further evaluation.  She is admitted by the Hospitalist Service for stroke workup; a head CT was negative for acute changes.  MRI and MRA with carotid Dopplers are pending.  PAST MEDICAL HISTORY:  Significant for: 1. Hypertension. 2. Diabetes. 3. Hyperlipidemia. 4. History of CVA in 2008. 5. History of endometrial cancer, status post hysterectomy.  MEDICATIONS: 1. Aspirin. 2. Exforge. 3. Foltx. 4. Gabapentin. 5. Glimepiride. 6. Lisinopril. 7. Omeprazole. 8. Hydrocodone. 9. Plavix.  ALLERGIES:  NKDA.  FAMILY HISTORY:  Noncontributory to this consult.  SOCIAL HISTORY:  The patient is widowed.  Lives with her son.  She is a retired Ambulance person.  REVIEW OF SYSTEMS:  GENERAL:  The patient states she has had increased generalized weakness over the last week or so.  No fevers or chills.  No increased bleeding. HEENT:  She gets lightheaded, especially upon standing.  She has no vision in the right eye.  No loss of speech or dysarthria. RESPIRATORY:  No shortness of breath and cough. CARDIOVASCULAR:  No chest pain or palpitations. GI:  Complains of mild nausea this morning, feeling better now. ENDOCRINE:  Has diabetes. VASCULAR:  No complaints of pain or extremity  edema. NEUROLOGIC:  Describes difficulties with gait and feeling off balance increased recently. VITAL SIGNS:  Blood pressure is 143/51, pulse 86, respirations 18, temperature 98.1. MENTAL STATUS:  The patient is alert and oriented x3.  He is able to carry out two-step commands. CRANIAL NERVES:  The left eye has a reactive pupil.  Conjugate gaze. EOMI.  Her left visual field appears to be intact, but she is unable to see movement or discriminate in the right visual field.  She has facial symmetry, tongue is midline.  Uvula is midline.  There is no dysarthria, aphasia, slurred speech, or droop.  Motor, she has 5/5 upper extremity strength, 4+/5 bilateral lower extremity strength.  Rapid alternating movements are intact bilaterally.  She has a fine resting tremor of the right upper extremity.  DTRs 2+ at the upper extremities, 2+ at patella, 2+ at the ankles, and downgoing plantars bilaterally.  Sensation is diminished in lower extremities bilaterally from the midcalf down (the patient has a history of diabetic neuropathy).  Slight positive left drift. LUNGS:  Clear to auscultation. CARDIAC:  Regular rate and rhythm without murmurs, gallops, or rubs. NECK:  Without audible bruit on exam.  LABORATORY DATA:  Sodium 140, potassium 3.3,  BUN 10, creatinine 0.5, glucose 128.  WBC 11.6, hemoglobin 15.5, platelets 350.  UA was negative.  Total cholesterol 189, HDL 57, LDL 110, triglycerides 108. A1c 6.2.  IMAGING:  CT of the head without contrast shows no acute changes.  ASSESSMENT:  This is a 77 year old white female with weakness and ataxia; has a history of cerebrovascular accident in 2008, and known occluded right vertebral artery, high-grade left vertebral stenosis. Presently, it is unclear the etiology of her increased weakness and ataxia.  Cannot rule out ischemic event.  Suspect that a component of her ataxia is contributed by her peripheral neuropathy and blindness in the right  eye.  PLAN: 1. Agree with MRA/MRI. 2. Carotid Dopplers. 3. Correct the potassium. 4. Check orthostatics. 5. Check TSH and B12. 6. Continue aspirin and Plavix therapy for now, would reconsider     medication changes if MRI is positive for stroke.  Thank you for allowing [place] to consult in assisting the management of this patient.    Luan Moore, P.A.   ______________________________ Thana Farr, MD   TCJ/MEDQ  D:  01/29/2011  T:  01/30/2011  Job:  [id number]  Electronically Signed by Delice Bison JERNEJCIC P.A. on 02/25/2011 07:27:03 AM Electronically Signed by Thana Farr MD on 02/26/2011 05:12:18 PM

## 2011-05-18 LAB — LIPID PANEL
Cholesterol: 156
LDL Cholesterol: 90
VLDL: 26

## 2011-05-18 LAB — BASIC METABOLIC PANEL
BUN: 14
Calcium: 9.5
Chloride: 101
GFR calc Af Amer: >60
GFR calc non Af Amer: >60
Glucose, Bld: 106 — ABNORMAL HIGH
Potassium: 4.1
Sodium: 138

## 2011-05-18 LAB — CARDIAC PANEL(CRET KIN+CKTOT+MB+TROPI)
CK, MB: 1.1
Total CK: 101
Troponin I: 0.01

## 2011-05-18 LAB — CBC
MCHC: 33.1
RDW: 14.5

## 2011-05-27 LAB — CBC
HCT: 37.2
HCT: 38.9
Hemoglobin: 12.5
Hemoglobin: 12.9
MCHC: 33.1
MCHC: 33.6
MCV: 89.2
MCV: 90.3
RBC: 4.17
RDW: 14.3

## 2011-05-27 LAB — DIFFERENTIAL
Basophils Absolute: 0
Basophils Relative: 0
Basophils Relative: 0
Eosinophils Absolute: 0.2
Eosinophils Relative: 0
Eosinophils Relative: 1
Monocytes Absolute: 0.1
Monocytes Absolute: 1.1 — ABNORMAL HIGH
Monocytes Relative: 1 — ABNORMAL LOW
Monocytes Relative: 8
Neutrophils Relative %: 63

## 2011-05-27 LAB — BASIC METABOLIC PANEL
CO2: 25
Chloride: 100
Creatinine, Ser: 0.63
GFR calc Af Amer: >60

## 2014-10-29 DIAGNOSIS — M6281 Muscle weakness (generalized): Secondary | ICD-10-CM | POA: Diagnosis not present

## 2014-10-29 DIAGNOSIS — E785 Hyperlipidemia, unspecified: Secondary | ICD-10-CM | POA: Diagnosis not present

## 2014-10-29 DIAGNOSIS — Z79899 Other long term (current) drug therapy: Secondary | ICD-10-CM | POA: Diagnosis not present

## 2014-10-29 DIAGNOSIS — I10 Essential (primary) hypertension: Secondary | ICD-10-CM | POA: Diagnosis not present

## 2014-10-29 DIAGNOSIS — Z7982 Long term (current) use of aspirin: Secondary | ICD-10-CM | POA: Diagnosis not present

## 2014-10-29 DIAGNOSIS — K5901 Slow transit constipation: Secondary | ICD-10-CM | POA: Diagnosis not present

## 2014-10-29 DIAGNOSIS — Z7401 Bed confinement status: Secondary | ICD-10-CM | POA: Diagnosis not present

## 2014-10-29 DIAGNOSIS — R279 Unspecified lack of coordination: Secondary | ICD-10-CM | POA: Diagnosis not present

## 2014-10-29 DIAGNOSIS — E119 Type 2 diabetes mellitus without complications: Secondary | ICD-10-CM | POA: Diagnosis not present

## 2014-10-29 DIAGNOSIS — Z792 Long term (current) use of antibiotics: Secondary | ICD-10-CM | POA: Diagnosis not present

## 2014-10-29 DIAGNOSIS — R197 Diarrhea, unspecified: Secondary | ICD-10-CM | POA: Diagnosis not present

## 2014-10-29 DIAGNOSIS — N390 Urinary tract infection, site not specified: Secondary | ICD-10-CM | POA: Diagnosis not present

## 2014-10-29 DIAGNOSIS — E780 Pure hypercholesterolemia: Secondary | ICD-10-CM | POA: Diagnosis not present

## 2014-10-29 DIAGNOSIS — R110 Nausea: Secondary | ICD-10-CM | POA: Diagnosis not present

## 2014-10-29 DIAGNOSIS — R6889 Other general symptoms and signs: Secondary | ICD-10-CM | POA: Diagnosis not present

## 2014-10-29 DIAGNOSIS — R109 Unspecified abdominal pain: Secondary | ICD-10-CM | POA: Diagnosis not present

## 2014-10-29 DIAGNOSIS — R262 Difficulty in walking, not elsewhere classified: Secondary | ICD-10-CM | POA: Diagnosis not present

## 2014-10-29 DIAGNOSIS — E1165 Type 2 diabetes mellitus with hyperglycemia: Secondary | ICD-10-CM | POA: Diagnosis not present

## 2014-10-29 DIAGNOSIS — K529 Noninfective gastroenteritis and colitis, unspecified: Secondary | ICD-10-CM | POA: Diagnosis not present

## 2014-11-03 DIAGNOSIS — K5901 Slow transit constipation: Secondary | ICD-10-CM | POA: Diagnosis not present

## 2014-11-03 DIAGNOSIS — N390 Urinary tract infection, site not specified: Secondary | ICD-10-CM | POA: Diagnosis not present

## 2014-11-03 DIAGNOSIS — I10 Essential (primary) hypertension: Secondary | ICD-10-CM | POA: Diagnosis not present

## 2014-11-03 DIAGNOSIS — K529 Noninfective gastroenteritis and colitis, unspecified: Secondary | ICD-10-CM | POA: Diagnosis not present

## 2014-11-13 DIAGNOSIS — K5901 Slow transit constipation: Secondary | ICD-10-CM | POA: Diagnosis not present

## 2014-11-13 DIAGNOSIS — N390 Urinary tract infection, site not specified: Secondary | ICD-10-CM | POA: Diagnosis not present

## 2014-11-13 DIAGNOSIS — K529 Noninfective gastroenteritis and colitis, unspecified: Secondary | ICD-10-CM | POA: Diagnosis not present

## 2014-11-13 DIAGNOSIS — I10 Essential (primary) hypertension: Secondary | ICD-10-CM | POA: Diagnosis not present

## 2014-11-24 DIAGNOSIS — D72829 Elevated white blood cell count, unspecified: Secondary | ICD-10-CM | POA: Diagnosis not present

## 2014-11-24 DIAGNOSIS — E876 Hypokalemia: Secondary | ICD-10-CM | POA: Diagnosis not present

## 2014-11-24 DIAGNOSIS — N3000 Acute cystitis without hematuria: Secondary | ICD-10-CM | POA: Diagnosis not present

## 2015-02-25 DIAGNOSIS — E1143 Type 2 diabetes mellitus with diabetic autonomic (poly)neuropathy: Secondary | ICD-10-CM | POA: Diagnosis not present

## 2015-02-25 DIAGNOSIS — E782 Mixed hyperlipidemia: Secondary | ICD-10-CM | POA: Diagnosis not present

## 2015-02-25 DIAGNOSIS — I10 Essential (primary) hypertension: Secondary | ICD-10-CM | POA: Diagnosis not present

## 2015-02-25 DIAGNOSIS — K219 Gastro-esophageal reflux disease without esophagitis: Secondary | ICD-10-CM | POA: Diagnosis not present

## 2015-02-25 DIAGNOSIS — E1165 Type 2 diabetes mellitus with hyperglycemia: Secondary | ICD-10-CM | POA: Diagnosis not present

## 2015-03-16 DIAGNOSIS — D3500 Benign neoplasm of unspecified adrenal gland: Secondary | ICD-10-CM | POA: Diagnosis not present

## 2015-03-16 DIAGNOSIS — D3502 Benign neoplasm of left adrenal gland: Secondary | ICD-10-CM | POA: Diagnosis not present

## 2015-03-16 DIAGNOSIS — R112 Nausea with vomiting, unspecified: Secondary | ICD-10-CM | POA: Diagnosis not present

## 2015-03-16 DIAGNOSIS — K5790 Diverticulosis of intestine, part unspecified, without perforation or abscess without bleeding: Secondary | ICD-10-CM | POA: Diagnosis not present

## 2015-03-16 DIAGNOSIS — R531 Weakness: Secondary | ICD-10-CM | POA: Diagnosis not present

## 2015-03-16 DIAGNOSIS — K219 Gastro-esophageal reflux disease without esophagitis: Secondary | ICD-10-CM | POA: Diagnosis not present

## 2015-03-16 DIAGNOSIS — R404 Transient alteration of awareness: Secondary | ICD-10-CM | POA: Diagnosis not present

## 2015-03-16 DIAGNOSIS — I517 Cardiomegaly: Secondary | ICD-10-CM | POA: Diagnosis not present

## 2015-03-16 DIAGNOSIS — R110 Nausea: Secondary | ICD-10-CM | POA: Diagnosis not present

## 2015-03-16 DIAGNOSIS — N2889 Other specified disorders of kidney and ureter: Secondary | ICD-10-CM | POA: Diagnosis not present

## 2015-03-16 DIAGNOSIS — N200 Calculus of kidney: Secondary | ICD-10-CM | POA: Diagnosis not present

## 2015-03-16 DIAGNOSIS — R51 Headache: Secondary | ICD-10-CM | POA: Diagnosis not present

## 2015-03-16 DIAGNOSIS — K5730 Diverticulosis of large intestine without perforation or abscess without bleeding: Secondary | ICD-10-CM | POA: Diagnosis not present

## 2015-06-16 DIAGNOSIS — I10 Essential (primary) hypertension: Secondary | ICD-10-CM | POA: Diagnosis not present

## 2015-06-16 DIAGNOSIS — E1143 Type 2 diabetes mellitus with diabetic autonomic (poly)neuropathy: Secondary | ICD-10-CM | POA: Diagnosis not present

## 2015-06-16 DIAGNOSIS — Z23 Encounter for immunization: Secondary | ICD-10-CM | POA: Diagnosis not present

## 2015-06-16 DIAGNOSIS — E1165 Type 2 diabetes mellitus with hyperglycemia: Secondary | ICD-10-CM | POA: Diagnosis not present

## 2015-06-16 DIAGNOSIS — E782 Mixed hyperlipidemia: Secondary | ICD-10-CM | POA: Diagnosis not present

## 2015-06-16 DIAGNOSIS — I639 Cerebral infarction, unspecified: Secondary | ICD-10-CM | POA: Diagnosis not present

## 2015-06-16 DIAGNOSIS — K219 Gastro-esophageal reflux disease without esophagitis: Secondary | ICD-10-CM | POA: Diagnosis not present

## 2015-09-22 DIAGNOSIS — E1143 Type 2 diabetes mellitus with diabetic autonomic (poly)neuropathy: Secondary | ICD-10-CM | POA: Diagnosis not present

## 2015-09-22 DIAGNOSIS — E782 Mixed hyperlipidemia: Secondary | ICD-10-CM | POA: Diagnosis not present

## 2015-09-22 DIAGNOSIS — I10 Essential (primary) hypertension: Secondary | ICD-10-CM | POA: Diagnosis not present

## 2015-12-30 DIAGNOSIS — K219 Gastro-esophageal reflux disease without esophagitis: Secondary | ICD-10-CM | POA: Diagnosis not present

## 2015-12-30 DIAGNOSIS — E1143 Type 2 diabetes mellitus with diabetic autonomic (poly)neuropathy: Secondary | ICD-10-CM | POA: Diagnosis not present

## 2015-12-30 DIAGNOSIS — E782 Mixed hyperlipidemia: Secondary | ICD-10-CM | POA: Diagnosis not present

## 2015-12-30 DIAGNOSIS — I10 Essential (primary) hypertension: Secondary | ICD-10-CM | POA: Diagnosis not present

## 2016-04-22 HISTORY — PX: JOINT REPLACEMENT: SHX530

## 2016-05-05 DIAGNOSIS — Z7982 Long term (current) use of aspirin: Secondary | ICD-10-CM | POA: Diagnosis not present

## 2016-05-05 DIAGNOSIS — S72009A Fracture of unspecified part of neck of unspecified femur, initial encounter for closed fracture: Secondary | ICD-10-CM | POA: Diagnosis not present

## 2016-05-05 DIAGNOSIS — S72012A Unspecified intracapsular fracture of left femur, initial encounter for closed fracture: Secondary | ICD-10-CM | POA: Diagnosis not present

## 2016-05-05 DIAGNOSIS — G629 Polyneuropathy, unspecified: Secondary | ICD-10-CM | POA: Diagnosis not present

## 2016-05-05 DIAGNOSIS — Z741 Need for assistance with personal care: Secondary | ICD-10-CM | POA: Diagnosis not present

## 2016-05-05 DIAGNOSIS — S72002A Fracture of unspecified part of neck of left femur, initial encounter for closed fracture: Secondary | ICD-10-CM | POA: Diagnosis not present

## 2016-05-05 DIAGNOSIS — I10 Essential (primary) hypertension: Secondary | ICD-10-CM | POA: Diagnosis not present

## 2016-05-05 DIAGNOSIS — Z7902 Long term (current) use of antithrombotics/antiplatelets: Secondary | ICD-10-CM | POA: Diagnosis not present

## 2016-05-05 DIAGNOSIS — Z5189 Encounter for other specified aftercare: Secondary | ICD-10-CM | POA: Diagnosis not present

## 2016-05-05 DIAGNOSIS — M25559 Pain in unspecified hip: Secondary | ICD-10-CM | POA: Diagnosis not present

## 2016-05-05 DIAGNOSIS — I69351 Hemiplegia and hemiparesis following cerebral infarction affecting right dominant side: Secondary | ICD-10-CM | POA: Diagnosis not present

## 2016-05-05 DIAGNOSIS — E871 Hypo-osmolality and hyponatremia: Secondary | ICD-10-CM | POA: Diagnosis not present

## 2016-05-05 DIAGNOSIS — H5441 Blindness, right eye, normal vision left eye: Secondary | ICD-10-CM | POA: Diagnosis not present

## 2016-05-05 DIAGNOSIS — K219 Gastro-esophageal reflux disease without esophagitis: Secondary | ICD-10-CM | POA: Diagnosis not present

## 2016-05-05 DIAGNOSIS — Z9181 History of falling: Secondary | ICD-10-CM | POA: Diagnosis not present

## 2016-05-05 DIAGNOSIS — I69959 Hemiplegia and hemiparesis following unspecified cerebrovascular disease affecting unspecified side: Secondary | ICD-10-CM | POA: Diagnosis not present

## 2016-05-05 DIAGNOSIS — S72042A Displaced fracture of base of neck of left femur, initial encounter for closed fracture: Secondary | ICD-10-CM | POA: Diagnosis not present

## 2016-05-05 DIAGNOSIS — Z96642 Presence of left artificial hip joint: Secondary | ICD-10-CM | POA: Diagnosis not present

## 2016-05-05 DIAGNOSIS — K5900 Constipation, unspecified: Secondary | ICD-10-CM | POA: Diagnosis not present

## 2016-05-05 DIAGNOSIS — S79912A Unspecified injury of left hip, initial encounter: Secondary | ICD-10-CM | POA: Diagnosis not present

## 2016-05-05 DIAGNOSIS — Z471 Aftercare following joint replacement surgery: Secondary | ICD-10-CM | POA: Diagnosis not present

## 2016-05-05 DIAGNOSIS — R259 Unspecified abnormal involuntary movements: Secondary | ICD-10-CM | POA: Diagnosis not present

## 2016-05-05 DIAGNOSIS — S299XXA Unspecified injury of thorax, initial encounter: Secondary | ICD-10-CM | POA: Diagnosis not present

## 2016-05-05 DIAGNOSIS — I69398 Other sequelae of cerebral infarction: Secondary | ICD-10-CM | POA: Diagnosis not present

## 2016-05-05 DIAGNOSIS — Z289 Immunization not carried out for unspecified reason: Secondary | ICD-10-CM | POA: Diagnosis not present

## 2016-05-05 DIAGNOSIS — E785 Hyperlipidemia, unspecified: Secondary | ICD-10-CM | POA: Diagnosis not present

## 2016-05-05 DIAGNOSIS — E7800 Pure hypercholesterolemia, unspecified: Secondary | ICD-10-CM | POA: Diagnosis not present

## 2016-05-05 DIAGNOSIS — Z79899 Other long term (current) drug therapy: Secondary | ICD-10-CM | POA: Diagnosis not present

## 2016-05-05 DIAGNOSIS — S72002S Fracture of unspecified part of neck of left femur, sequela: Secondary | ICD-10-CM | POA: Diagnosis not present

## 2016-05-05 DIAGNOSIS — I69898 Other sequelae of other cerebrovascular disease: Secondary | ICD-10-CM | POA: Diagnosis not present

## 2016-05-07 DIAGNOSIS — S72002A Fracture of unspecified part of neck of left femur, initial encounter for closed fracture: Secondary | ICD-10-CM

## 2016-05-07 DIAGNOSIS — E871 Hypo-osmolality and hyponatremia: Secondary | ICD-10-CM | POA: Diagnosis not present

## 2016-05-09 DIAGNOSIS — E7800 Pure hypercholesterolemia, unspecified: Secondary | ICD-10-CM | POA: Diagnosis not present

## 2016-05-09 DIAGNOSIS — Z96649 Presence of unspecified artificial hip joint: Secondary | ICD-10-CM | POA: Diagnosis not present

## 2016-05-09 DIAGNOSIS — S72009A Fracture of unspecified part of neck of unspecified femur, initial encounter for closed fracture: Secondary | ICD-10-CM | POA: Diagnosis not present

## 2016-05-09 DIAGNOSIS — S72002A Fracture of unspecified part of neck of left femur, initial encounter for closed fracture: Secondary | ICD-10-CM | POA: Diagnosis not present

## 2016-05-09 DIAGNOSIS — M439 Deforming dorsopathy, unspecified: Secondary | ICD-10-CM | POA: Diagnosis not present

## 2016-05-09 DIAGNOSIS — Z9181 History of falling: Secondary | ICD-10-CM | POA: Diagnosis not present

## 2016-05-09 DIAGNOSIS — Z96642 Presence of left artificial hip joint: Secondary | ICD-10-CM | POA: Diagnosis not present

## 2016-05-09 DIAGNOSIS — G629 Polyneuropathy, unspecified: Secondary | ICD-10-CM | POA: Diagnosis not present

## 2016-05-09 DIAGNOSIS — S72002S Fracture of unspecified part of neck of left femur, sequela: Secondary | ICD-10-CM | POA: Diagnosis not present

## 2016-05-09 DIAGNOSIS — K219 Gastro-esophageal reflux disease without esophagitis: Secondary | ICD-10-CM | POA: Diagnosis not present

## 2016-05-09 DIAGNOSIS — Z5189 Encounter for other specified aftercare: Secondary | ICD-10-CM | POA: Diagnosis not present

## 2016-05-09 DIAGNOSIS — I69959 Hemiplegia and hemiparesis following unspecified cerebrovascular disease affecting unspecified side: Secondary | ICD-10-CM | POA: Diagnosis not present

## 2016-05-09 DIAGNOSIS — S72002D Fracture of unspecified part of neck of left femur, subsequent encounter for closed fracture with routine healing: Secondary | ICD-10-CM | POA: Diagnosis not present

## 2016-05-09 DIAGNOSIS — I69898 Other sequelae of other cerebrovascular disease: Secondary | ICD-10-CM | POA: Diagnosis not present

## 2016-05-09 DIAGNOSIS — Z741 Need for assistance with personal care: Secondary | ICD-10-CM | POA: Diagnosis not present

## 2016-05-09 DIAGNOSIS — G8918 Other acute postprocedural pain: Secondary | ICD-10-CM | POA: Diagnosis not present

## 2016-05-09 DIAGNOSIS — I10 Essential (primary) hypertension: Secondary | ICD-10-CM | POA: Diagnosis not present

## 2016-05-09 DIAGNOSIS — E871 Hypo-osmolality and hyponatremia: Secondary | ICD-10-CM | POA: Diagnosis not present

## 2016-05-09 DIAGNOSIS — R259 Unspecified abnormal involuntary movements: Secondary | ICD-10-CM | POA: Diagnosis not present

## 2016-05-09 DIAGNOSIS — R29898 Other symptoms and signs involving the musculoskeletal system: Secondary | ICD-10-CM | POA: Diagnosis not present

## 2016-05-16 DIAGNOSIS — G8918 Other acute postprocedural pain: Secondary | ICD-10-CM | POA: Diagnosis not present

## 2016-05-16 DIAGNOSIS — R29898 Other symptoms and signs involving the musculoskeletal system: Secondary | ICD-10-CM | POA: Diagnosis not present

## 2016-05-16 DIAGNOSIS — M439 Deforming dorsopathy, unspecified: Secondary | ICD-10-CM | POA: Diagnosis not present

## 2016-05-16 DIAGNOSIS — S72002D Fracture of unspecified part of neck of left femur, subsequent encounter for closed fracture with routine healing: Secondary | ICD-10-CM | POA: Diagnosis not present

## 2016-06-07 DIAGNOSIS — Z96649 Presence of unspecified artificial hip joint: Secondary | ICD-10-CM | POA: Diagnosis not present

## 2016-06-13 DIAGNOSIS — E1151 Type 2 diabetes mellitus with diabetic peripheral angiopathy without gangrene: Secondary | ICD-10-CM | POA: Diagnosis not present

## 2016-06-13 DIAGNOSIS — I10 Essential (primary) hypertension: Secondary | ICD-10-CM | POA: Diagnosis not present

## 2016-06-13 DIAGNOSIS — I69398 Other sequelae of cerebral infarction: Secondary | ICD-10-CM | POA: Diagnosis not present

## 2016-06-13 DIAGNOSIS — E1142 Type 2 diabetes mellitus with diabetic polyneuropathy: Secondary | ICD-10-CM | POA: Diagnosis not present

## 2016-06-13 DIAGNOSIS — Z96642 Presence of left artificial hip joint: Secondary | ICD-10-CM | POA: Diagnosis not present

## 2016-06-13 DIAGNOSIS — W19XXXD Unspecified fall, subsequent encounter: Secondary | ICD-10-CM | POA: Diagnosis not present

## 2016-06-13 DIAGNOSIS — Z9181 History of falling: Secondary | ICD-10-CM | POA: Diagnosis not present

## 2016-06-13 DIAGNOSIS — Z7902 Long term (current) use of antithrombotics/antiplatelets: Secondary | ICD-10-CM | POA: Diagnosis not present

## 2016-06-13 DIAGNOSIS — H5440 Blindness, one eye, unspecified eye: Secondary | ICD-10-CM | POA: Diagnosis not present

## 2016-06-13 DIAGNOSIS — Z79891 Long term (current) use of opiate analgesic: Secondary | ICD-10-CM | POA: Diagnosis not present

## 2016-06-13 DIAGNOSIS — S72012D Unspecified intracapsular fracture of left femur, subsequent encounter for closed fracture with routine healing: Secondary | ICD-10-CM | POA: Diagnosis not present

## 2016-06-13 DIAGNOSIS — I69351 Hemiplegia and hemiparesis following cerebral infarction affecting right dominant side: Secondary | ICD-10-CM | POA: Diagnosis not present

## 2016-06-14 DIAGNOSIS — S72012D Unspecified intracapsular fracture of left femur, subsequent encounter for closed fracture with routine healing: Secondary | ICD-10-CM | POA: Diagnosis not present

## 2016-06-14 DIAGNOSIS — Z7902 Long term (current) use of antithrombotics/antiplatelets: Secondary | ICD-10-CM | POA: Diagnosis not present

## 2016-06-14 DIAGNOSIS — E1151 Type 2 diabetes mellitus with diabetic peripheral angiopathy without gangrene: Secondary | ICD-10-CM | POA: Diagnosis not present

## 2016-06-14 DIAGNOSIS — Z96642 Presence of left artificial hip joint: Secondary | ICD-10-CM | POA: Diagnosis not present

## 2016-06-14 DIAGNOSIS — E1142 Type 2 diabetes mellitus with diabetic polyneuropathy: Secondary | ICD-10-CM | POA: Diagnosis not present

## 2016-06-14 DIAGNOSIS — Z79891 Long term (current) use of opiate analgesic: Secondary | ICD-10-CM | POA: Diagnosis not present

## 2016-06-14 DIAGNOSIS — W19XXXD Unspecified fall, subsequent encounter: Secondary | ICD-10-CM | POA: Diagnosis not present

## 2016-06-14 DIAGNOSIS — I69398 Other sequelae of cerebral infarction: Secondary | ICD-10-CM | POA: Diagnosis not present

## 2016-06-14 DIAGNOSIS — I69351 Hemiplegia and hemiparesis following cerebral infarction affecting right dominant side: Secondary | ICD-10-CM | POA: Diagnosis not present

## 2016-06-14 DIAGNOSIS — I10 Essential (primary) hypertension: Secondary | ICD-10-CM | POA: Diagnosis not present

## 2016-06-14 DIAGNOSIS — Z9181 History of falling: Secondary | ICD-10-CM | POA: Diagnosis not present

## 2016-06-14 DIAGNOSIS — H5440 Blindness, one eye, unspecified eye: Secondary | ICD-10-CM | POA: Diagnosis not present

## 2016-06-16 ENCOUNTER — Emergency Department (HOSPITAL_COMMUNITY): Payer: Medicare Other

## 2016-06-16 ENCOUNTER — Encounter (HOSPITAL_COMMUNITY): Payer: Self-pay | Admitting: Family Medicine

## 2016-06-16 ENCOUNTER — Inpatient Hospital Stay (HOSPITAL_COMMUNITY)
Admission: EM | Admit: 2016-06-16 | Discharge: 2016-06-20 | DRG: 378 | Disposition: A | Discharge status: Skilled Nursing Facility | Payer: Medicare Other | Attending: Internal Medicine | Admitting: Internal Medicine

## 2016-06-16 DIAGNOSIS — H547 Unspecified visual loss: Secondary | ICD-10-CM | POA: Diagnosis not present

## 2016-06-16 DIAGNOSIS — Z7902 Long term (current) use of antithrombotics/antiplatelets: Secondary | ICD-10-CM

## 2016-06-16 DIAGNOSIS — Z96642 Presence of left artificial hip joint: Secondary | ICD-10-CM | POA: Diagnosis present

## 2016-06-16 DIAGNOSIS — I2510 Atherosclerotic heart disease of native coronary artery without angina pectoris: Secondary | ICD-10-CM | POA: Diagnosis not present

## 2016-06-16 DIAGNOSIS — Z8673 Personal history of transient ischemic attack (TIA), and cerebral infarction without residual deficits: Secondary | ICD-10-CM | POA: Diagnosis not present

## 2016-06-16 DIAGNOSIS — R000 Tachycardia, unspecified: Secondary | ICD-10-CM | POA: Diagnosis not present

## 2016-06-16 DIAGNOSIS — T82897S Other specified complication of cardiac prosthetic devices, implants and grafts, sequela: Secondary | ICD-10-CM | POA: Diagnosis not present

## 2016-06-16 DIAGNOSIS — G629 Polyneuropathy, unspecified: Secondary | ICD-10-CM | POA: Diagnosis present

## 2016-06-16 DIAGNOSIS — D62 Acute posthemorrhagic anemia: Secondary | ICD-10-CM | POA: Diagnosis present

## 2016-06-16 DIAGNOSIS — R262 Difficulty in walking, not elsewhere classified: Secondary | ICD-10-CM | POA: Diagnosis not present

## 2016-06-16 DIAGNOSIS — E119 Type 2 diabetes mellitus without complications: Secondary | ICD-10-CM | POA: Diagnosis not present

## 2016-06-16 DIAGNOSIS — Z955 Presence of coronary angioplasty implant and graft: Secondary | ICD-10-CM | POA: Diagnosis not present

## 2016-06-16 DIAGNOSIS — Z8542 Personal history of malignant neoplasm of other parts of uterus: Secondary | ICD-10-CM | POA: Diagnosis not present

## 2016-06-16 DIAGNOSIS — T82855D Stenosis of coronary artery stent, subsequent encounter: Secondary | ICD-10-CM | POA: Diagnosis not present

## 2016-06-16 DIAGNOSIS — E785 Hyperlipidemia, unspecified: Secondary | ICD-10-CM | POA: Diagnosis present

## 2016-06-16 DIAGNOSIS — R404 Transient alteration of awareness: Secondary | ICD-10-CM | POA: Diagnosis not present

## 2016-06-16 DIAGNOSIS — M6281 Muscle weakness (generalized): Secondary | ICD-10-CM | POA: Diagnosis not present

## 2016-06-16 DIAGNOSIS — Z7982 Long term (current) use of aspirin: Secondary | ICD-10-CM

## 2016-06-16 DIAGNOSIS — I10 Essential (primary) hypertension: Secondary | ICD-10-CM | POA: Diagnosis present

## 2016-06-16 DIAGNOSIS — I639 Cerebral infarction, unspecified: Secondary | ICD-10-CM | POA: Diagnosis present

## 2016-06-16 DIAGNOSIS — R55 Syncope and collapse: Secondary | ICD-10-CM

## 2016-06-16 DIAGNOSIS — K921 Melena: Secondary | ICD-10-CM | POA: Diagnosis present

## 2016-06-16 DIAGNOSIS — T82897A Other specified complication of cardiac prosthetic devices, implants and grafts, initial encounter: Secondary | ICD-10-CM | POA: Diagnosis present

## 2016-06-16 DIAGNOSIS — K5731 Diverticulosis of large intestine without perforation or abscess with bleeding: Secondary | ICD-10-CM | POA: Diagnosis not present

## 2016-06-16 DIAGNOSIS — K625 Hemorrhage of anus and rectum: Secondary | ICD-10-CM | POA: Diagnosis present

## 2016-06-16 DIAGNOSIS — K922 Gastrointestinal hemorrhage, unspecified: Secondary | ICD-10-CM

## 2016-06-16 DIAGNOSIS — H5461 Unqualified visual loss, right eye, normal vision left eye: Secondary | ICD-10-CM | POA: Diagnosis not present

## 2016-06-16 DIAGNOSIS — E1143 Type 2 diabetes mellitus with diabetic autonomic (poly)neuropathy: Secondary | ICD-10-CM | POA: Diagnosis not present

## 2016-06-16 DIAGNOSIS — I951 Orthostatic hypotension: Secondary | ICD-10-CM | POA: Diagnosis not present

## 2016-06-16 DIAGNOSIS — E118 Type 2 diabetes mellitus with unspecified complications: Secondary | ICD-10-CM

## 2016-06-16 DIAGNOSIS — Z79899 Other long term (current) drug therapy: Secondary | ICD-10-CM

## 2016-06-16 DIAGNOSIS — C541 Malignant neoplasm of endometrium: Secondary | ICD-10-CM | POA: Diagnosis not present

## 2016-06-16 DIAGNOSIS — D649 Anemia, unspecified: Secondary | ICD-10-CM | POA: Diagnosis not present

## 2016-06-16 DIAGNOSIS — R531 Weakness: Secondary | ICD-10-CM | POA: Diagnosis not present

## 2016-06-16 DIAGNOSIS — R05 Cough: Secondary | ICD-10-CM | POA: Diagnosis not present

## 2016-06-16 LAB — I-STAT CHEM 8, ED
BUN: 23 mg/dL — AB (ref 6–20)
CHLORIDE: 103 mmol/L (ref 101–111)
CREATININE: 0.6 mg/dL (ref 0.44–1.00)
Calcium, Ion: 1.06 mmol/L — ABNORMAL LOW (ref 1.15–1.40)
GLUCOSE: 141 mg/dL — AB (ref 65–99)
HCT: 19 % — ABNORMAL LOW (ref 36.0–46.0)
HEMOGLOBIN: 6.5 g/dL — AB (ref 12.0–15.0)
POTASSIUM: 4.5 mmol/L (ref 3.5–5.1)
Sodium: 137 mmol/L (ref 135–145)
TCO2: 23 mmol/L (ref 0–100)

## 2016-06-16 LAB — URINALYSIS, ROUTINE W REFLEX MICROSCOPIC
GLUCOSE, UA: NEGATIVE mg/dL
Hgb urine dipstick: NEGATIVE
KETONES UR: NEGATIVE mg/dL
LEUKOCYTES UA: NEGATIVE
NITRITE: NEGATIVE
PROTEIN: NEGATIVE mg/dL
Specific Gravity, Urine: 1.022 (ref 1.005–1.030)
pH: 6 (ref 5.0–8.0)

## 2016-06-16 LAB — COMPREHENSIVE METABOLIC PANEL
ALK PHOS: 72 U/L (ref 38–126)
ALT: 10 U/L — AB (ref 14–54)
AST: 14 U/L — AB (ref 15–41)
Albumin: 2.4 g/dL — ABNORMAL LOW (ref 3.5–5.0)
Anion gap: 6 (ref 5–15)
BILIRUBIN TOTAL: 0.4 mg/dL (ref 0.3–1.2)
BUN: 23 mg/dL — AB (ref 6–20)
CALCIUM: 7.5 mg/dL — AB (ref 8.9–10.3)
CO2: 23 mmol/L (ref 22–32)
CREATININE: 0.66 mg/dL (ref 0.44–1.00)
Chloride: 105 mmol/L (ref 101–111)
Glucose, Bld: 146 mg/dL — ABNORMAL HIGH (ref 65–99)
Potassium: 4.5 mmol/L (ref 3.5–5.1)
Sodium: 134 mmol/L — ABNORMAL LOW (ref 135–145)
TOTAL PROTEIN: 4.5 g/dL — AB (ref 6.5–8.1)

## 2016-06-16 LAB — CBC
HCT: 22 % — ABNORMAL LOW (ref 36.0–46.0)
Hemoglobin: 7.1 g/dL — ABNORMAL LOW (ref 12.0–15.0)
MCH: 30.2 pg (ref 26.0–34.0)
MCHC: 32.3 g/dL (ref 30.0–36.0)
MCV: 93.6 fL (ref 78.0–100.0)
PLATELETS: 356 10*3/uL (ref 150–400)
RBC: 2.35 MIL/uL — ABNORMAL LOW (ref 3.87–5.11)
RDW: 14.2 % (ref 11.5–15.5)
WBC: 10.2 10*3/uL (ref 4.0–10.5)

## 2016-06-16 LAB — LACTIC ACID, PLASMA: LACTIC ACID, VENOUS: 1.6 mmol/L (ref 0.5–1.9)

## 2016-06-16 LAB — PREPARE RBC (CROSSMATCH)

## 2016-06-16 LAB — I-STAT TROPONIN, ED: TROPONIN I, POC: 0 ng/mL (ref 0.00–0.08)

## 2016-06-16 LAB — PROTIME-INR
INR: 1.35
Prothrombin Time: 16.8 seconds — ABNORMAL HIGH (ref 11.4–15.2)

## 2016-06-16 LAB — GLUCOSE, CAPILLARY: GLUCOSE-CAPILLARY: 157 mg/dL — AB (ref 65–99)

## 2016-06-16 LAB — I-STAT CG4 LACTIC ACID, ED: Lactic Acid, Venous: 1.49 mmol/L (ref 0.5–1.9)

## 2016-06-16 LAB — ABO/RH: ABO/RH(D): O POS

## 2016-06-16 MED ORDER — SODIUM CHLORIDE 0.9% FLUSH: 3.0000 mL | INTRAVENOUS | Status: DC | PRN

## 2016-06-16 MED ORDER — SODIUM CHLORIDE 0.9% FLUSH
3.0000 mL | Freq: Two times a day (BID) | INTRAVENOUS | Status: DC
  Administered 2016-06-17 – 2016-06-18 (×2): 3 mL via INTRAVENOUS

## 2016-06-16 MED ORDER — SODIUM CHLORIDE 0.9% FLUSH
3.0000 mL | Freq: Two times a day (BID) | INTRAVENOUS | Status: DC
  Administered 2016-06-16 – 2016-06-19 (×5): 3 mL via INTRAVENOUS

## 2016-06-16 MED ORDER — SODIUM CHLORIDE 0.9 % IV SOLN: Freq: Once | INTRAVENOUS | Status: DC

## 2016-06-16 MED ORDER — INSULIN ASPART 100 UNIT/ML ~~LOC~~ SOLN
0.0000 [IU] | SUBCUTANEOUS | Status: DC
  Administered 2016-06-16: 2 [IU] via SUBCUTANEOUS
  Administered 2016-06-17 – 2016-06-20 (×6): 1 [IU] via SUBCUTANEOUS

## 2016-06-16 MED ORDER — SODIUM CHLORIDE 0.9 % IV BOLUS (SEPSIS)
1000.0000 mL | Freq: Once | INTRAVENOUS | Status: AC
  Administered 2016-06-16: 1000 mL via INTRAVENOUS

## 2016-06-16 MED ORDER — SODIUM CHLORIDE 0.9 % IV BOLUS (SEPSIS)
500.0000 mL | Freq: Once | INTRAVENOUS | Status: AC
  Administered 2016-06-16: 500 mL via INTRAVENOUS

## 2016-06-16 MED ORDER — ACETAMINOPHEN 500 MG PO TABS
500.0000 mg | ORAL_TABLET | Freq: Once | ORAL | Status: AC
  Administered 2016-06-16: 500 mg via ORAL
  Filled 2016-06-16: qty 1

## 2016-06-16 MED ORDER — SODIUM CHLORIDE 0.9 % IV SOLN
10.0000 mL/h | Freq: Once | INTRAVENOUS | Status: AC
  Administered 2016-06-16: 10 mL/h via INTRAVENOUS

## 2016-06-16 MED ORDER — SODIUM CHLORIDE 0.9 % IV SOLN: 250.0000 mL | INTRAVENOUS | Status: DC | PRN

## 2016-06-16 NOTE — H&P (Signed)
History and Physical    [name] Kim [name] Kim DOB: [birth year] DOA: 06/16/2016  PCP: Rochel Brome, MD  Patient coming from: home  Chief Complaint:  Bleeding from rectum, passed out  HPI: [name] Kim is a 82 y.o. female with medical history significant of previous CVA on plavix/aspirin, DM, HTN, occluded coronary stent comes in after passing out several times today in the setting of brbpr.  Pt reports she had several bowel movements with "alot of blood in the toilet" that was bright red today.  She denies any abdominal pain.  She tried to get up and got very weak and passed out.   EMS was called, she lives with her son.  While trying to get up and into the ambulance she passed out again.  She has not had any further bleeding since the episodes at home.  She has no h/o GIB in the past.  She denies fevers.  On arrival to the ED her HR was in the 130s and sbp around 90.  Pt was given 500cc ivf bolus, and one unit of prbc was ordered by the ED.  Pt persistented to be tachycardic with soft bp.  GI on call was called for consultation.  Pt was then referred for admission for acute GIB.  I have requested for ED to give more fluids, as she has not gotten blood products yet.    Pt did have a knee replacement last month, and has been recovering well from this at home.  Review of Systems: As per HPI otherwise 10 point review of systems negative.   Past Medical History:  Diagnosis Date  . Blindness   . CVA (cerebral infarction) 2008  . Diabetes mellitus, type 2 ([place])   . Dyslipidemia   . Endometrial cancer ([place])   . Hypertension   . Occluded coronary artery stent   . Peripheral neuropathy [facility])     Past Surgical History:  Procedure Laterality Date  . hysterectomy       has no tobacco, alcohol, and drug history on file.  Not on File  No family history on file.  Prior to Admission medications   Medication Sig Start Date End Date Taking? Authorizing Provider  acetaminophen (TYLENOL  ARTHRITIS PAIN) 650 MG CR tablet Take 650 mg by mouth every 6 (six) hours as needed for pain.   Yes Historical Provider, MD  amLODipine (NORVASC) 5 MG tablet Take 5 mg by mouth daily.   Yes Historical Provider, MD  aspirin 81 MG tablet Take 81 mg by mouth daily.   Yes Historical Provider, MD  atorvastatin (LIPITOR) 10 MG tablet Take 10 mg by mouth daily at 6 PM.   Yes Historical Provider, MD  clopidogrel (PLAVIX) 75 MG tablet Take 75 mg by mouth at bedtime.   Yes Historical Provider, MD  diazepam (VALIUM) 5 MG tablet Take 5 mg by mouth 2 (two) times daily. 06/13/16  Yes Historical Provider, MD  gabapentin (NEURONTIN) 300 MG capsule Take 300 mg by mouth 3 (three) times daily.   Yes Historical Provider, MD  omeprazole (PRILOSEC) 20 MG capsule Take 20 mg by mouth daily.   Yes Historical Provider, MD  valsartan (DIOVAN) 320 MG tablet Take 320 mg by mouth daily.   Yes Historical Provider, MD  Cholecalciferol (VITAMIN D-3 PO) Take 1,000 Units by mouth. Take 2    Historical Provider, MD  Fish Oil OIL Take 100 mg by mouth daily. Take two 100 mg    Historical Provider, MD  Folic Acid-Vit  B6-Vit B12 (FOLPLEX 2.2 PO) Take 2.2 mg by mouth daily.    Historical Provider, MD  gabapentin (NEURONTIN) 100 MG capsule Take 100 mg by mouth 3 (three) times daily.    Historical Provider, MD  glimepiride (AMARYL) 4 MG tablet Take 4 mg by mouth daily with breakfast.    Historical Provider, MD  HYDROcodone-acetaminophen (VICODIN) 5-500 MG per tablet Take 1 tablet by mouth every 6 (six) hours as needed for pain.    Historical Provider, MD  lisinopril (PRINIVIL,ZESTRIL) 20 MG tablet Take 20 mg by mouth daily.    Historical Provider, MD  methylcellulose (ARTIFICIAL TEARS) 1 % ophthalmic solution Place 1 drop into both eyes as needed.    Historical Provider, MD  rosuvastatin (CRESTOR) 10 MG tablet Take 10 mg by mouth daily. At bedtime    Historical Provider, MD    Physical Exam: Vitals:   06/16/16 1915 06/16/16 1922  06/16/16 1930 06/16/16 1945  BP: 142/58 (!) 150/51 129/63 141/59  Pulse: 114 111 114 87  Resp: 17 19 21 20   Temp:  98.6 F (37 C)    TempSrc:  Oral    SpO2: 100% 100% 100% 99%    Constitutional: NAD, calm, comfortable Vitals:   06/16/16 1915 06/16/16 1922 06/16/16 1930 06/16/16 1945  BP: 142/58 (!) 150/51 129/63 141/59  Pulse: 114 111 114 87  Resp: 17 19 21 20   Temp:  98.6 F (37 C)    TempSrc:  Oral    SpO2: 100% 100% 100% 99%   Eyes: PERRL, lids and conjunctivae normal ENMT: Mucous membranes are moist. Posterior pharynx clear of any exudate or lesions.Normal dentition.  Neck: normal, supple, no masses, no thyromegaly Respiratory: clear to auscultation bilaterally, no wheezing, no crackles. Normal respiratory effort. No accessory muscle use.  Cardiovascular: Regular rate and rhythm, no murmurs / rubs / gallops. No extremity edema. 2+ pedal pulses. No carotid bruits.  Abdomen: no tenderness, no masses palpated. No hepatosplenomegaly. Bowel sounds positive.  Musculoskeletal: no clubbing / cyanosis. No joint deformity upper and lower extremities. Good ROM, no contractures. Normal muscle tone.  Skin: no rashes, lesions, ulcers. No induration Neurologic: CN 2-12 grossly intact. Sensation intact, DTR normal. Strength 5/5 in all 4.  Psychiatric: Normal judgment and insight. Alert and oriented x 3. Normal mood.    Labs on Admission: I have personally reviewed following labs and imaging studies  CBC:  Recent Labs Lab 06/16/16 1702 06/16/16 1715  WBC  --  10.2  HGB 6.5* 7.1*  HCT 19.0* 22.0*  MCV  --  93.6  PLT  --  [id number]   Basic Metabolic Panel:  Recent Labs Lab 06/16/16 1702 06/16/16 1717  NA 137 134*  K 4.5 4.5  CL 103 105  CO2  --  23  GLUCOSE 141* 146*  BUN 23* 23*  CREATININE 0.60 0.66  CALCIUM  --  7.5*   GFR: CrCl cannot be calculated (Unknown ideal weight.). Liver Function Tests:  Recent Labs Lab 06/16/16 1717  AST 14*  ALT 10*  ALKPHOS 72    BILITOT 0.4  PROT 4.5*  ALBUMIN 2.4*    Coagulation Profile:  Recent Labs Lab 06/16/16 1715  INR 1.35    Urine analysis:    Component Value Date/Time   COLORURINE YELLOW 06/16/2016 [place] 06/16/2016 1706   LABSPEC 1.022 06/16/2016 1706   PHURINE 6.0 06/16/2016 1706   GLUCOSEU NEGATIVE 06/16/2016 1706   HGBUR NEGATIVE 06/16/2016 1706   BILIRUBINUR SMALL (A) 06/16/2016 1706  KETONESUR NEGATIVE 06/16/2016 1706   PROTEINUR NEGATIVE 06/16/2016 1706   UROBILINOGEN 0.2 01/28/2011 1639   NITRITE NEGATIVE 06/16/2016 1706   LEUKOCYTESUR NEGATIVE 06/16/2016 1706   Radiological Exams on Admission: Dg Chest Portable 1 View  Result Date: 06/16/2016 CLINICAL DATA:  GI bleeding.  Nonproductive cough EXAM: PORTABLE CHEST 1 VIEW COMPARISON:  05/05/2016 FINDINGS: Prominent left ventricle. Negative aortic and hilar contours. There is no edema, consolidation, effusion, or pneumothorax. IMPRESSION: Stable from prior.  No evidence of acute disease. Electronically Signed   By: Monte Fantasia M.D.   On: 06/16/2016 16:21    Assessment/Plan 82 yo female with acute blood loss anemia (unknown baseline) with syncopal episodes, tachycardia, soft blood pressure on plavix and aspirin  Principal Problem:   Hematochezia- pt should be given ivf resuscitation while awaiting whole blood products in the setting of abnormal vitals.  Have awaited for her to get 2 liters of ivf and her bp has improved along with some improvement in her HR.  Have changed the order from one to two units of prbcs (hgb6.5).  No further bleeding in the ED.  GI has been called and will consult in am.  Hold plavix/aspirin and all bp meds.  Monitor closely for any further bleeding.  Hold anticoagulants.  Safe for patient to go to telemetry floor.  Likely a diverticular bleed.  Has no prior h/o colonoscopy in the past per pt report.  Active Problems:   Acute blood loss anemia- as above,  Repeat h/h in am.  Sooner if  any bleeding recurs.  Transfuse 2 units.   Occluded coronary artery stent- noted, holding plavix/aspirin   CVA 2008- noted   Diabetes mellitus, type 2 ([place])- ssi   Endometrial cancer ([place])- noted   Hypertension   Blindness    DVT prophylaxis: scds Code Status:   full Family Communication: none  Disposition Plan:  Per day team Consults called:  GI Admission status:   Full admission    Xayden Linsey A MD [facility]  If 7PM-7AM, please contact night-coverage [email or web address] Password [email or web address]  06/16/2016, 8:02 PM

## 2016-06-16 NOTE — ED Notes (Signed)
Report called to 5W RN.  Patient stable to be transported at this time.  Belongings taken by patient to [facility]

## 2016-06-16 NOTE — ED Triage Notes (Signed)
Pt brought by [facility] for black stool and pt also has syncope event upon standing with fire berfore [facility] arrival . Pt is alert and ox4 but appears pale.

## 2016-06-16 NOTE — ED Provider Notes (Signed)
[facility] DEPT Provider Note   CSN: [id number] Arrival date & time: 06/16/16  1526  History   Chief Complaint Chief Complaint  Patient presents with  . GI Bleeding    HPI [name] Kim is a 82 y.o. female.   Rectal Bleeding  Quality:  Bright red Amount:  Copious Timing:  Intermittent Chronicity:  New Similar prior episodes: no   Associated symptoms: abdominal pain, dizziness, light-headedness and loss of consciousness   Associated symptoms: no epistaxis, no fever, no hematemesis and no vomiting   Risk factors comment:  History of plavix   Past Medical History:  Diagnosis Date  . Blindness   . CVA (cerebral infarction) 2008  . Diabetes mellitus, type 2   . Dyslipidemia   . Endometrial cancer   . Hypertension   . Occluded coronary artery stent   . Peripheral neuropathy    There are no active problems to display for this patient.  Past Surgical History:  Procedure Laterality Date  . hysterectomy     OB History    No data available     Home Medications    Prior to Admission medications   Medication Sig Start Date End Date Taking? Authorizing Provider  acetaminophen (TYLENOL ARTHRITIS PAIN) 650 MG CR tablet Take 650 mg by mouth every 6 (six) hours as needed for pain.    Historical Provider, MD  aspirin 81 MG tablet Take 81 mg by mouth daily.    Historical Provider, MD  Cholecalciferol (VITAMIN D-3 PO) Take 1,000 Units by mouth. Take 2    Historical Provider, MD  clopidogrel (PLAVIX) 75 MG tablet Take 75 mg by mouth at bedtime.    Historical Provider, MD  Fish Oil OIL Take 100 mg by mouth daily. Take two 100 mg    Historical Provider, MD  Folic Acid-Vit [id number] (FOLPLEX 2.2 PO) Take 2.2 mg by mouth daily.    Historical Provider, MD  gabapentin (NEURONTIN) 100 MG capsule Take 100 mg by mouth 3 (three) times daily.    Historical Provider, MD  glimepiride (AMARYL) 4 MG tablet Take 4 mg by mouth daily with breakfast.    Historical Provider, MD    HYDROcodone-acetaminophen (VICODIN) 5-500 MG per tablet Take 1 tablet by mouth every 6 (six) hours as needed for pain.    Historical Provider, MD  lisinopril (PRINIVIL,ZESTRIL) 20 MG tablet Take 20 mg by mouth daily.    Historical Provider, MD  methylcellulose (ARTIFICIAL TEARS) 1 % ophthalmic solution Place 1 drop into both eyes as needed.    Historical Provider, MD  omeprazole (PRILOSEC) 20 MG capsule Take 20 mg by mouth daily.    Historical Provider, MD  rosuvastatin (CRESTOR) 10 MG tablet Take 10 mg by mouth daily. At bedtime    Historical Provider, MD   Family History No family history on file.  Social History Social History  Substance Use Topics  . Smoking status: Not on file  . Smokeless tobacco: Not on file  . Alcohol use Not on file    Allergies   Review of patient's allergies indicates not on file.  Review of Systems Review of Systems  Constitutional: Negative for fever.  HENT: Negative for nosebleeds.   Respiratory: Positive for cough.   Gastrointestinal: Positive for abdominal pain and hematochezia. Negative for constipation, diarrhea, hematemesis and vomiting.  Genitourinary: Negative for dysuria and flank pain.  Neurological: Positive for dizziness, loss of consciousness, syncope, weakness and light-headedness.  All other systems reviewed and are negative.  Physical Exam  Updated Vital Signs There were no vitals taken for this visit.  Physical Exam  Constitutional: She is oriented to person, place, and time. She appears well-developed and well-nourished. No distress.  HENT:  Head: Normocephalic.  Eyes: Pupils are equal, round, and reactive to light.  Neck: Normal range of motion.  Cardiovascular:  Tachycardic  Pulmonary/Chest: No respiratory distress. She has no wheezes.  Abdominal: She exhibits no distension and no mass. There is tenderness. There is no rebound and no guarding.  Mild diffuse tenderness without rebound or guarding  Musculoskeletal: Normal  range of motion. She exhibits no edema.  Neurological: She is alert and oriented to person, place, and time.  Skin: Skin is warm. Capillary refill takes less than 2 seconds. She is not diaphoretic.  Psychiatric: Her behavior is normal. Thought content normal.  Nursing note and vitals reviewed.  ED Treatments / Results  Labs (all labs ordered are listed, but only abnormal results are displayed) Labs Reviewed  CBC - Abnormal; Notable for the following:       Result Value   RBC 2.35 (*)    Hemoglobin 7.1 (*)    HCT 22.0 (*)    All other components within normal limits  URINALYSIS, ROUTINE W REFLEX MICROSCOPIC (NOT AT [facility]) - Abnormal; Notable for the following:    Bilirubin Urine SMALL (*)    All other components within normal limits  PROTIME-INR - Abnormal; Notable for the following:    Prothrombin Time 16.8 (*)    All other components within normal limits  COMPREHENSIVE METABOLIC PANEL - Abnormal; Notable for the following:    Sodium 134 (*)    Glucose, Bld 146 (*)    BUN 23 (*)    Calcium 7.5 (*)    Total Protein 4.5 (*)    Albumin 2.4 (*)    AST 14 (*)    ALT 10 (*)    All other components within normal limits  I-STAT CHEM 8, ED - Abnormal; Notable for the following:    BUN 23 (*)    Glucose, Bld 141 (*)    Calcium, Ion 1.06 (*)    Hemoglobin 6.5 (*)    HCT 19.0 (*)    All other components within normal limits  LACTIC ACID, PLASMA  LACTIC ACID, PLASMA  CBG MONITORING, ED  I-STAT TROPOININ, ED  I-STAT CG4 LACTIC ACID, ED  TYPE AND SCREEN  ABO/RH  PREPARE RBC (CROSSMATCH)   EKG  EKG Interpretation  Date/Time:  Thursday June 16 2016 15:27:39 EDT Ventricular Rate:  122 PR Interval:    QRS Duration: 98 QT Interval:  330 QTC Calculation: 471 R Axis:   -52 Text Interpretation:  Sinus tachycardia Left anterior fascicular block Probable anteroseptal infarct, old new tachycardia compared to previous Confirmed by LITTLE MD, RACHEL [phone]) on 06/16/2016 3:42:42 PM        Radiology Dg Chest Portable 1 View  Result Date: 06/16/2016 CLINICAL DATA:  GI bleeding.  Nonproductive cough EXAM: PORTABLE CHEST 1 VIEW COMPARISON:  05/05/2016 FINDINGS: Prominent left ventricle. Negative aortic and hilar contours. There is no edema, consolidation, effusion, or pneumothorax. IMPRESSION: Stable from prior.  No evidence of acute disease. Electronically Signed   By: Monte Fantasia M.D.   On: 06/16/2016 16:21    Procedures Procedures (including critical care time)  Medications Ordered in ED Medications  0.9 %  sodium chloride infusion (not administered)  sodium chloride 0.9 % bolus 1,000 mL (not administered)  sodium chloride 0.9 % bolus 500 mL (0  mLs Intravenous Stopped 06/16/16 1700)    Initial Impression / Assessment and Plan / ED Course  I have reviewed the triage vital signs and the nursing notes.  Pertinent labs & imaging results that were available during my care of the patient were reviewed by me and considered in my medical decision making (see chart for details).  Clinical Course   Patient with recent discharge from SNF s/p hip replacement and discharged home from SNF on Sunday presents with lightheadedness, syncope and GI bleed.  Today had multiple episodes of blood in stool. Currently on plavix but no other anticoagulation. No previous scope.   Tachycardic with soft BP. Normal lactic acid. No other signs or symptoms of infectious or cardiac etiology for her syncope.   No BM additionally here. Patient found to have Hgb at 6.5.   GI consulted.   Hospitalist consulted for step down unit vs floor.  Patient transfused 2 units.   Patient admitted to hospital without further incident.   Final Clinical Impressions(s) / ED Diagnoses   Final diagnoses:  Acute GI bleeding  Syncope and collapse      Roberto Scales, MD 06/17/16 [place], MD 06/22/16 [phone]

## 2016-06-16 NOTE — ED Notes (Signed)
Delay in lab draw,  Pt receiving blood at this time.

## 2016-06-16 NOTE — ED Notes (Signed)
MD at bedside. 

## 2016-06-16 NOTE — ED Notes (Signed)
Granddaughter states she must leave.  [name] Kim [id number]

## 2016-06-16 NOTE — ED Notes (Signed)
Hold CBG per Dr. Martyn Ehrich.

## 2016-06-16 NOTE — ED Notes (Signed)
Spoke with Shanon Brow MD regarding the RBC transfusion.  MD states she wants the patient to have 2 units total.

## 2016-06-17 DIAGNOSIS — D62 Acute posthemorrhagic anemia: Secondary | ICD-10-CM

## 2016-06-17 DIAGNOSIS — T82897S Other specified complication of cardiac prosthetic devices, implants and grafts, sequela: Secondary | ICD-10-CM

## 2016-06-17 DIAGNOSIS — I639 Cerebral infarction, unspecified: Secondary | ICD-10-CM

## 2016-06-17 DIAGNOSIS — I10 Essential (primary) hypertension: Secondary | ICD-10-CM

## 2016-06-17 DIAGNOSIS — K921 Melena: Secondary | ICD-10-CM

## 2016-06-17 DIAGNOSIS — H547 Unspecified visual loss: Secondary | ICD-10-CM

## 2016-06-17 LAB — BASIC METABOLIC PANEL
Anion gap: 8 (ref 5–15)
BUN: 24 mg/dL — AB (ref 6–20)
CHLORIDE: 107 mmol/L (ref 101–111)
CO2: 22 mmol/L (ref 22–32)
Calcium: 7.7 mg/dL — ABNORMAL LOW (ref 8.9–10.3)
Creatinine, Ser: 0.68 mg/dL (ref 0.44–1.00)
GFR calc Af Amer: >60 mL/min (ref >60)
GFR calc non Af Amer: >60 mL/min (ref >60)
GLUCOSE: 125 mg/dL — AB (ref 65–99)
POTASSIUM: 3.9 mmol/L (ref 3.5–5.1)
Sodium: 137 mmol/L (ref 135–145)

## 2016-06-17 LAB — GLUCOSE, CAPILLARY
GLUCOSE-CAPILLARY: 120 mg/dL — AB (ref 65–99)
GLUCOSE-CAPILLARY: 113 mg/dL — AB (ref 65–99)
GLUCOSE-CAPILLARY: 121 mg/dL — AB (ref 65–99)
GLUCOSE-CAPILLARY: 132 mg/dL — AB (ref 65–99)
Glucose-Capillary: 118 mg/dL — ABNORMAL HIGH (ref 65–99)
Glucose-Capillary: 125 mg/dL — ABNORMAL HIGH (ref 65–99)

## 2016-06-17 LAB — CBC
HCT: 25.5 % — ABNORMAL LOW (ref 36.0–46.0)
HEMOGLOBIN: 8.8 g/dL — AB (ref 12.0–15.0)
MCH: 30.6 pg (ref 26.0–34.0)
MCHC: 34.5 g/dL (ref 30.0–36.0)
MCV: 88.5 fL (ref 78.0–100.0)
Platelets: 262 10*3/uL (ref 150–400)
RBC: 2.88 MIL/uL — AB (ref 3.87–5.11)
RDW: 16.6 % — ABNORMAL HIGH (ref 11.5–15.5)
WBC: 9.7 10*3/uL (ref 4.0–10.5)

## 2016-06-17 MED ORDER — PANTOPRAZOLE SODIUM 40 MG IV SOLR
40.0000 mg | Freq: Two times a day (BID) | INTRAVENOUS | Status: DC
  Administered 2016-06-17 (×2): 40 mg via INTRAVENOUS
  Filled 2016-06-17 (×3): qty 40

## 2016-06-17 MED ORDER — ACETAMINOPHEN-CODEINE 120-12 MG/5ML PO SOLN: 5.0000 mL | Freq: Once | ORAL | Status: DC

## 2016-06-17 MED ORDER — ACETAMINOPHEN 500 MG PO TABS
500.0000 mg | ORAL_TABLET | Freq: Once | ORAL | Status: AC
Start: 2016-06-17 — End: 2016-06-17
  Administered 2016-06-17: 500 mg via ORAL
  Filled 2016-06-17: qty 1

## 2016-06-17 MED ORDER — SODIUM CHLORIDE 0.9 % IV SOLN
INTRAVENOUS | Status: DC
  Administered 2016-06-17 – 2016-06-18 (×3): via INTRAVENOUS

## 2016-06-17 NOTE — Evaluation (Signed)
Physical Therapy Evaluation Patient Details Name: [name] Kim MRN: [id number] DOB: 03-08-1929 Today's Date: 06/17/2016   History of Present Illness  82 yo admitted with syncope and hematochezia from home. D/C'd from SNF on 10/22 after rehab for left hip hemiarthoplasty due to fall Sept 15. PMhx: CVA, DM, HTN, endometrial CA  Clinical Impression  Pt pleasant and limited due to generalized weakness and fatigue. Pt reports son and friend live with her and assist but only home from SNF 4 days when admitted. Pt with limited progression at SNF and only walking 20' max on return home with assist to get up stairs into house. Pt with increased difficulty with mobility since admission and will benefit from acute therapy to maximize mobility, function, gait and strength to decrease burden of care and improve above and below deficits. Recommend OOB with nursing daily.     Follow Up Recommendations SNF;Supervision/Assistance - 24 hour    Equipment Recommendations  None recommended by PT    Recommendations for Other Services OT consult     Precautions / Restrictions Precautions Precautions: Fall;Posterior Hip (per pt) Restrictions Weight Bearing Restrictions: Yes LLE Weight Bearing: Weight bearing as tolerated Other Position/Activity Restrictions: per pt      Mobility  Bed Mobility Overal bed mobility: Needs Assistance Bed Mobility: Supine to Sit;Rolling Rolling: Modified independent (Device/Increase time)   Supine to sit: Supervision     General bed mobility comments: increased time with supervision for lines and safety with cues to maintain precautions. pt able to bridge safely in bed for use of bedpan  Transfers Overall transfer level: Needs assistance   Transfers: Sit to/from Stand;Stand Pivot Transfers Sit to Stand: Min assist Stand pivot transfers: Min assist       General transfer comment: cues for hand placement and position of LLE. Pt able to stand from bed and use RW to  take a few steps to sit in chair.  Ambulation/Gait             General Gait Details: pt denied attempting at this time  Stairs            Wheelchair Mobility    Modified Rankin (Stroke Patients Only)       Balance Overall balance assessment: Needs assistance   Sitting balance-Leahy Scale: Fair       Standing balance-Leahy Scale: Poor                               Pertinent Vitals/Pain Pain Assessment: No/denies pain    Home Living Family/patient expects to be discharged to:: Private residence Living Arrangements: Children Available Help at Discharge: Family;Available 24 hours/day Type of Home: Mobile home Home Access: Stairs to enter   Entrance Stairs-Number of Steps: 3 Home Layout: One level Home Equipment: Walker - 2 wheels;Bedside commode;Wheelchair - manual      Prior Function Level of Independence: Needs assistance   Gait / Transfers Assistance Needed: pt reports since hip hemi and SNF stay she has only been able to walk 20', assist for stairs, WC for longer distance  ADL's / Homemaking Assistance Needed: mod I for sponge bathing and dressing, family providing homemaking        Hand Dominance        Extremity/Trunk Assessment   Upper Extremity Assessment: Generalized weakness           Lower Extremity Assessment: Generalized weakness      Cervical / Trunk Assessment: Kyphotic  Communication   Communication: HOH  Cognition Arousal/Alertness: Awake/alert Behavior During Therapy: WFL for tasks assessed/performed Overall Cognitive Status: Within Functional Limits for tasks assessed                      General Comments      Exercises     Assessment/Plan    PT Assessment Patient needs continued PT services  PT Problem List Decreased strength;Decreased mobility;Decreased activity tolerance;Decreased range of motion;Decreased knowledge of precautions;Decreased balance;Decreased knowledge of use of DME           PT Treatment Interventions Gait training;Stair training;Functional mobility training;Therapeutic exercise;Patient/family education;DME instruction;Therapeutic activities    PT Goals (Current goals can be found in the Care Plan section)  Acute Rehab PT Goals Patient Stated Goal: be able to walk PT Goal Formulation: With patient Time For Goal Achievement: 07/01/16 Potential to Achieve Goals: Fair    Frequency Min 3X/week   Barriers to discharge Decreased caregiver support      Co-evaluation               End of Session Equipment Utilized During Treatment: Gait belt Activity Tolerance: Patient tolerated treatment well Patient left: in chair;with call bell/phone within reach;with chair alarm set Nurse Communication: Mobility status;Precautions;Weight bearing status         Time: 1257-1316 PT Time Calculation (min) (ACUTE ONLY): 19 min   Charges:   PT Evaluation $PT Eval Moderate Complexity: 1 Procedure     PT G CodesMelford Aase 06/17/2016, 1:33 PM  Elwyn Reach, [place]

## 2016-06-17 NOTE — Progress Notes (Signed)
[facility]                                                                              Patient Demographics  [name] Kim, is a 82 y.o. female, DOB - 1929-07-05, [id number]  Admit date - 06/16/2016   Admitting Physician Phillips Grout, MD  Outpatient Primary MD for the patient is Rochel Brome, MD  Outpatient specialists:   LOS - 1  days    Chief Complaint  Patient presents with  . GI Bleeding       Brief summary   Patient is 82 year old female with history of CVA on plavix/aspirin, DM, HTN, occluded coronary stent presented after passing out several times on the day of admission with rectal bleeding.  Pt reported she had several bowel movements with "a lot of blood in the toilet" that was bright red.  She denied any abdominal pain, tried to get up and got very weak and passed out. She has no h/o GIB in the past.  On arrival to the ED her HR was in the 130s and sbp around 90.  Pt was given 500cc ivf bolus with 1 unit packed RBC transfusion  GI on call was called for consultation. Hemoglobin was 6.5 at the time of admission.   Assessment & Plan   GI bleed with acute blood loss anemia, hemoglobin 6.5 at the time of admission, orthostatic - Continue IV fluid hydration still orthostatic - Received 1 unit packed RBC transfusion overnight, hemoglobin 8.8 this morning, small amount of bleeding this morning - GI consulted, recommended IV PPI and EGD - Hold aspirin and Plavix  CAD. Occluded coronary artery stent - For now hold aspirin and Plavix  History of CVA 2008 - Continue to hold aspirin and Plavix for now  Diabetes mellitus - Currently on full liquids, sliding scale insulin  Hypertension - Currently hypotensive and orthostatic, continue IV fluids  Code Status:Full CODE STATUS  DVT Prophylaxis: SCD's Family Communication: Discussed in detail with the patient, all imaging results, lab results explained to the patient  Disposition Plan:   Time Spent  in minutes  25 minutes  Procedures:    Consultants:   GI   Antimicrobials:      Medications  Scheduled Meds: . insulin aspart  0-9 Units Subcutaneous Q4H  . pantoprazole (PROTONIX) IV  40 mg Intravenous Q12H  . sodium chloride flush  3 mL Intravenous Q12H  . sodium chloride flush  3 mL Intravenous Q12H   Continuous Infusions: . sodium chloride 100 mL/hr at 06/17/16 0853   PRN Meds:.sodium chloride, sodium chloride flush   Antibiotics   Anti-infectives    None        Subjective:   [name] Kim was seen and examined today. Still dizzy and weak, small amount of bleeding this am.  Patient denies dizziness, chest pain, shortness of breath, abdominal pain, N/V/D/C.    Objective:   Vitals:   06/17/16 0757 06/17/16 0800 06/17/16 0801 06/17/16 0802  BP: (!) 140/51 139/68 99/62 (!) 127/56  Pulse: (!) 109 (!) 124 (!) 128 (!) 129  Resp:      Temp:  TempSrc:      SpO2: 100% 100% (!) 61% 100%  Weight:      Height:        Intake/Output Summary (Last 24 hours) at 06/17/16 1043 Last data filed at 06/17/16 0730  Gross per 24 hour  Intake             1200 ml  Output              200 ml  Net             1000 ml     Wt Readings from Last 3 Encounters:  06/16/16 74.8 kg (164 lb 14.5 oz)     Exam  General: Alert and oriented x 3, NAD  HEENT:  PERRLA, EOMI, Anicteric Sclera  Neck: Supple, no JVD, no masses  Cardiovascular: S1 S2 auscultated, no rubs, murmurs or gallops. Regular rate and rhythm.  Respiratory: Clear to auscultation bilaterally, no wheezing, rales or rhonchi  Gastrointestinal: Soft, nontender, nondistended, + bowel sounds  Ext: no cyanosis clubbing or edema  Neuro: AAOx3, Cr N's II- XII. Strength 5/5 upper and lower extremities bilaterally  Skin: No rashes  Psych: Normal affect and demeanor, alert and oriented x3    Data Reviewed:  I have personally reviewed following labs and imaging studies  Micro Results No results found for  this or any previous visit (from the past 240 hour(s)).  Radiology Reports Dg Chest Portable 1 View  Result Date: 06/16/2016 CLINICAL DATA:  GI bleeding.  Nonproductive cough EXAM: PORTABLE CHEST 1 VIEW COMPARISON:  05/05/2016 FINDINGS: Prominent left ventricle. Negative aortic and hilar contours. There is no edema, consolidation, effusion, or pneumothorax. IMPRESSION: Stable from prior.  No evidence of acute disease. Electronically Signed   By: Monte Fantasia M.D.   On: 06/16/2016 16:21    Lab Data:  CBC:  Recent Labs Lab 06/16/16 1702 06/16/16 1715 06/17/16 0540  WBC  --  10.2 9.7  HGB 6.5* 7.1* 8.8*  HCT 19.0* 22.0* 25.5*  MCV  --  93.6 88.5  PLT  --  356 [id number]   Basic Metabolic Panel:  Recent Labs Lab 06/16/16 1702 06/16/16 1717 06/17/16 0540  NA 137 134* 137  K 4.5 4.5 3.9  CL 103 105 107  CO2  --  23 22  GLUCOSE 141* 146* 125*  BUN 23* 23* 24*  CREATININE 0.60 0.66 0.68  CALCIUM  --  7.5* 7.7*   GFR: Estimated Creatinine Clearance: 50.9 mL/min (by C-G formula based on SCr of 0.68 mg/dL). Liver Function Tests:  Recent Labs Lab 06/16/16 1717  AST 14*  ALT 10*  ALKPHOS 72  BILITOT 0.4  PROT 4.5*  ALBUMIN 2.4*   No results for input(s): LIPASE, AMYLASE in the last 168 hours. No results for input(s): AMMONIA in the last 168 hours. Coagulation Profile:  Recent Labs Lab 06/16/16 1715  INR 1.35   Cardiac Enzymes: No results for input(s): CKTOTAL, CKMB, CKMBINDEX, TROPONINI in the last 168 hours. BNP (last 3 results) No results for input(s): PROBNP in the last 8760 hours. HbA1C: No results for input(s): HGBA1C in the last 72 hours. CBG:  Recent Labs Lab 06/16/16 2113 06/17/16 0143 06/17/16 0511 06/17/16 0738  GLUCAP 157* 120* 113* 121*   Lipid Profile: No results for input(s): CHOL, HDL, LDLCALC, TRIG, CHOLHDL, LDLDIRECT in the last 72 hours. Thyroid Function Tests: No results for input(s): TSH, T4TOTAL, FREET4, T3FREE, THYROIDAB in the  last 72 hours. Anemia Panel: No results for input(s): VITAMINB12,  FOLATE, FERRITIN, TIBC, IRON, RETICCTPCT in the last 72 hours. Urine analysis:    Component Value Date/Time   COLORURINE YELLOW 06/16/2016 1706   APPEARANCEUR CLEAR 06/16/2016 1706   LABSPEC 1.022 06/16/2016 1706   PHURINE 6.0 06/16/2016 1706   GLUCOSEU NEGATIVE 06/16/2016 1706   HGBUR NEGATIVE 06/16/2016 1706   BILIRUBINUR SMALL (A) 06/16/2016 1706   KETONESUR NEGATIVE 06/16/2016 1706   PROTEINUR NEGATIVE 06/16/2016 1706   UROBILINOGEN 0.2 01/28/2011 1639   NITRITE NEGATIVE 06/16/2016 1706   LEUKOCYTESUR NEGATIVE 06/16/2016 1706     [name] Kim M.D. [facility] 06/17/2016, 10:43 AM  Pager: [phone] Between 7am to 7pm - call Pager - [phone]  After 7pm go to [email or web address] - password [email or web address]  Call night coverage person covering after 7pm

## 2016-06-17 NOTE — Progress Notes (Signed)
Orthostatic VS taken after patient supine >10 minutes.  Supine BP 140/51 HR 109 Sitting BP 139/68 HR 124 Standing BP 99/62 HR 128 Standing 3 minutes BP 127/56 HR 129  Patient had complaints of "feeling dizzy" while standing.   Dr. Tana Coast made aware.

## 2016-06-17 NOTE — Consult Note (Signed)
[facility] CM Primary Care Navigator  06/17/2016  [name] Kim 12-16-1928 [id number]  Met with patient at the bedside to identify discharge needs. Patient verbalized having "a lot of bleeding from rectum" that led to this admission. Patient confirmed Dr. Rochel Brome with [facility]) as the primary care provider.   Patient shared using [facility] in [place] to obtain medications with no problem. Patient states managing her own medications at home using the "pill box" system weekly.  Son lives with patient in a mobile home. Son Wynetta Emery) provides transportation to her doctors' appointments. Patient's friend Jackelyn Poling) who stays with her, provides assistance with care needs and is the primary caregiver at home.  Patient states being discharged 4 days ago from [facility] for rehabilitation from left hip replacement. Inpatient case manager requested PT evaluation for patient related to weakness.   PT recommends  SNF;Supervision/Assistance - 24 hour.       Patient expressed understanding to call primary care provider's office once discharged, for a post discharge follow-up appointment within a week or sooner if needs arise. Patient letter provided as a reminder  For additional questions please contact:  Edwena Felty A. Haidynn Almendarez, BSN, RN-BC [facility] PRIMARY CARE Navigator Cell: [phone]

## 2016-06-17 NOTE — Clinical Social Work Note (Signed)
Clinical Social Work Assessment  Patient Details  Name: [name] Kim MRN: [id number] Date of Birth: Mar 26, 1929  Date of referral:  06/17/16               Reason for consult:  Facility Placement                Permission sought to share information with:  Facility Art therapist granted to share information::  No  Name::        Agency::  SNFs  Relationship::     Contact Information:     Housing/Transportation Living arrangements for the past 2 months:  Single Family Home Source of Information:  Patient Patient Interpreter Needed:  None Criminal Activity/Legal Involvement Pertinent to Current Situation/Hospitalization:  No - Comment as needed Significant Relationships:  Adult Children Lives with:  Self Do you feel safe going back to the place where you live?  No Need for family participation in patient care:  No (Coment)  Care giving concerns:  CSW received consult for possible SNF placement at time of discharge. CSW met with patient regarding PT recommendation of SNF placement at time of discharge. Patient states she lives alone and is currently unable to care for patient at home given patient's current physical needs and fall risk. Patient expressed understanding of PT recommendation and is agreeable to SNF placement at time of discharge. CSW to continue to follow and assist with discharge planning needs.   Social Worker assessment / plan:  CSW spoke with patient concerning possibility of rehab at [facility] before returning home.  Employment status:  Retired Nurse, adult PT Recommendations:  [place] / Referral to community resources:  [place]  Patient/Family's Response to care:  Patient recognizes need for rehab before returning home and are agreeable to a SNF in [place]. Patient reported preference for [facility] [place].  Patient/Family's Understanding of and Emotional Response to Diagnosis,  Current Treatment, and Prognosis:  Patient/family is realistic regarding therapy needs and expressed being hopeful for SNF placement. Patient expressed understanding of CSW role and discharge process. No questions/concerns about plan or treatment.    Emotional Assessment Appearance:  Appears stated age Attitude/Demeanor/Rapport:  Other (Appropriate) Affect (typically observed):  Accepting Orientation:  Oriented to Self, Oriented to Place, Oriented to  Time, Oriented to Situation Alcohol / Substance use:  Not Applicable Psych involvement (Current and /or in the community):  No (Comment)  Discharge Needs  Concerns to be addressed:  Care Coordination Readmission within the last 30 days:    Current discharge risk:  None Barriers to Discharge:  Continued Medical Work up   [employer], [place] 06/17/2016, 5:34 PM

## 2016-06-17 NOTE — Consult Note (Signed)
Gastroenterology Consult: 9:22 AM 06/17/2016  LOS: 1 day    Referring Provider: Dr. Tana Coast  Primary Care Physician:  Rochel Brome, MD Primary Gastroenterologist:  Althia Forts    Reason for Consultation:  Hematochezia   HPI: [name] Kim is a 82 y.o. female.  Suffered CVA in 2008. This affected the vision in her right eye and cause some right-sided weakness. The weakness has improved significantly over the years. She takes chronic Plavix. On September 15 she broke her right hip with a fall. She underwent hip replacement in [place] and was discharged to rehabilitation facility.  She was released to home on around 10/22.  Patient has long history of chronic constipation. She uses over-the-counter Correctol laxatives every 2 or 3 days and has bowel movements then. During rehabilitation stay she required manual disimpaction. There was a little bit of blood per rectum with this but it did not persist. When she returned home earlier this week, she was having regular bowel movements. Yesterday she noticed that she had passed medium to burgundy colored stools. She did not have abdominal pain but she was quite weak and had a syncopal spell when EMS arrived. Hemoglobin at [facility] was 6.5. Her MCV is 93. She has received 2 PRBCs, hemoglobin now 8.8.  BUN 24, normal creatinine. Coags normal. She was orthostatic on arrival and again this morning but this has responded to fluid resuscitation. She has had some queasiness for at least a few weeks. Home meds include omeprazole 20 mg daily, Plavix as well as 81 mg aspirin. She doesn't take NSAIDs. Doesn't drink or smoke.  Patient has never undergone colonoscopy or upper endoscopy. She denies significant weight loss. No dysphagia.    Past Medical History:  Diagnosis Date  .  Blindness   . CVA (cerebral infarction) 2008  . Diabetes mellitus, type 2 ([place])   . Dyslipidemia   . Endometrial cancer ([place])   . Hypertension   . Occluded coronary artery stent   . Peripheral neuropathy [facility])     Past Surgical History:  Procedure Laterality Date  . hysterectomy    . JOINT REPLACEMENT  04/2016   left hip partial replacement    Prior to Admission medications   Medication Sig Start Date End Date Taking? Authorizing Provider  acetaminophen (TYLENOL ARTHRITIS PAIN) 650 MG CR tablet Take 650 mg by mouth every 6 (six) hours as needed for pain.   Yes Historical Provider, MD  amLODipine (NORVASC) 5 MG tablet Take 5 mg by mouth daily.   Yes Historical Provider, MD  aspirin 81 MG tablet Take 81 mg by mouth daily.   Yes Historical Provider, MD  atorvastatin (LIPITOR) 10 MG tablet Take 10 mg by mouth daily at 6 PM.   Yes Historical Provider, MD  clopidogrel (PLAVIX) 75 MG tablet Take 75 mg by mouth at bedtime.   Yes Historical Provider, MD  diazepam (VALIUM) 5 MG tablet Take 5 mg by mouth 2 (two) times daily. 06/13/16  Yes Historical Provider, MD  gabapentin (NEURONTIN) 300 MG capsule Take 300 mg by mouth 3 (  three) times daily.   Yes Historical Provider, MD  omeprazole (PRILOSEC) 20 MG capsule Take 20 mg by mouth daily.   Yes Historical Provider, MD  valsartan (DIOVAN) 320 MG tablet Take 320 mg by mouth daily.   Yes Historical Provider, MD    Scheduled Meds: . insulin aspart  0-9 Units Subcutaneous Q4H  . sodium chloride flush  3 mL Intravenous Q12H  . sodium chloride flush  3 mL Intravenous Q12H   Infusions: . sodium chloride 100 mL/hr at 06/17/16 0853   PRN Meds: sodium chloride, sodium chloride flush   Allergies as of 06/16/2016  . (Not on File)    History reviewed. No pertinent family history.  Social History   Social History  . Marital status: Widowed    Spouse name: N/A  . Number of children: N/A  . Years of education: N/A   Occupational History  .  Not on file.   Social History Main Topics  . Smoking status: Never Smoker  . Smokeless tobacco: Never Used  . Alcohol use No  . Drug use: No  . Sexual activity: No   Other Topics Concern  . Not on file   Social History Narrative  . No narrative on file    REVIEW OF SYSTEMS: Constitutional:  No excessive fatigue. No significant weakness up until the bleeding started yesterday. ENT:  No nose bleeds Pulm:  Bleeding process. No chest pain. No palpitations. CV:  No palpitations, no LE edema.  GU:  No hematuria, no frequency GI:  Per history of present illness. Heme:  Per history of present illness.   Transfusions:  No history of previous anemia or need for iron/B12/folate supplements per her recall. Neuro:  No headaches, no peripheral tingling or numbness.  Blindness in the right eye but her vision on the left is good. She is able to read and watch television Derm:  No itching, no rash or sores.  Endocrine:  No sweats or chills.  No polyuria or dysuria Immunization:  Did not inquire. Travel:  None beyond local counties in last few months.    PHYSICAL EXAM: Vital signs in last 24 hours: Vitals:   06/17/16 0801 06/17/16 0802  BP: 99/62 (!) 127/56  Pulse: (!) 128 (!) 129  Resp:    Temp:     Wt Readings from Last 3 Encounters:  06/16/16 74.8 kg (164 lb 14.5 oz)    General: Pleasant, comfortable elderly WF. She looks good for her age and is not particularly frail in appearance. Head:  No facial asymmetry, signs of head trauma or swelling.  Eyes:  No conjunctival pallor.  Blind in the right eye. Ears:  Slightly hard of hearing.  Nose:  No congestion or discharge Mouth:  Clear, moist Neck:  No masses, no TMG. No JVD. Lungs:  Clear bilaterally. No cough. No dyspnea. Heart: RRR. Soft 1/6 systolic murmur. S1, S2 present. Abdomen:  Soft. Not tender. No masses. No HSM. Active bowel sounds..   Rectal: Digital exam not performed because when the sheets were removed she had a pool  of melenic stool seeping up into her thighs.   Musc/Skeltl: Hip replacement scar on the right looks well Extremities:  No CCE.  Neurologic:  Patient is alert. Oriented 3. Good historian. No extremity tremors. Grip strength and foot flexion/extension full Skin:  No telangiectasia, no rashes, no sores, no significant bruising.   Psych:  Pleasant, cooperative. In good spirits. I'll  Intake/Output from previous day: 10/26 0701 - 10/27 0700 In:  1200 [Blood:700; IV Piggyback:500] Out: 100 [Urine:100] Intake/Output this shift: Total I/O In: -  Out: 100 [Urine:100]  LAB RESULTS:  Recent Labs  06/16/16 1702 06/16/16 1715 06/17/16 0540  WBC  --  10.2 9.7  HGB 6.5* 7.1* 8.8*  HCT 19.0* 22.0* 25.5*  PLT  --  356 262   BMET Lab Results  Component Value Date   NA 137 06/17/2016   NA 134 (L) 06/16/2016   NA 137 06/16/2016   K 3.9 06/17/2016   K 4.5 06/16/2016   K 4.5 06/16/2016   CL 107 06/17/2016   CL 105 06/16/2016   CL 103 06/16/2016   CO2 22 06/17/2016   CO2 23 06/16/2016   CO2 26 01/28/2011   GLUCOSE 125 (H) 06/17/2016   GLUCOSE 146 (H) 06/16/2016   GLUCOSE 141 (H) 06/16/2016   BUN 24 (H) 06/17/2016   BUN 23 (H) 06/16/2016   BUN 23 (H) 06/16/2016   CREATININE 0.68 06/17/2016   CREATININE 0.66 06/16/2016   CREATININE 0.60 06/16/2016   CALCIUM 7.7 (L) 06/17/2016   CALCIUM 7.5 (L) 06/16/2016   CALCIUM 9.5 01/28/2011   LFT  Recent Labs  06/16/16 1717  PROT 4.5*  ALBUMIN 2.4*  AST 14*  ALT 10*  ALKPHOS 72  BILITOT 0.4   PT/INR Lab Results  Component Value Date   INR 1.35 06/16/2016   INR 1.11 01/28/2011   INR 1.06 03/04/2010   Hepatitis Panel No results for input(s): HEPBSAG, HCVAB, HEPAIGM, HEPBIGM in the last 72 hours. C-Diff No components found for: CDIFF Lipase     Component Value Date/Time   LIPASE 22 01/28/2011 2236    Drugs of Abuse  No results found for: LABOPIA, COCAINSCRNUR, LABBENZ, AMPHETMU, THCU, LABBARB   RADIOLOGY STUDIES: Dg  Chest Portable 1 View  Result Date: 06/16/2016 CLINICAL DATA:  GI bleeding.  Nonproductive cough EXAM: PORTABLE CHEST 1 VIEW COMPARISON:  05/05/2016 FINDINGS: Prominent left ventricle. Negative aortic and hilar contours. There is no edema, consolidation, effusion, or pneumothorax. IMPRESSION: Stable from prior.  No evidence of acute disease. Electronically Signed   By: Monte Fantasia M.D.   On: 06/16/2016 16:21    IMPRESSION:   *  GI bleed. By their appearance of stool I saw, this is an upper GI bleed. Her BUN is somewhat elevated which would support diagnosis of upper GI bleed. Rule out ulcer, rule out AVMs. Rule out neoplasia.  *  Chronic Plavix owing to history of CVA with retinal involvement and resulting right eye blindness.  She last took her Plavix in the morning of 06/15/16.  Anemia of acute GI blood loss   PLAN:     *  Best first test is probably an upper endoscopy to rule out upper GI source of bleeding.  *  Start IV Protonix BID, do not want substitution with IV Pepcid.  *   Leave the liquid diet in place until timing of EGD determined.     Azucena Freed  06/17/2016, 9:22 AM Pager: [phone]   I have reviewed the entire case in detail with the above APP and discussed the plan in detail.  Therefore, I agree with the diagnoses recorded above. In addition,  I have personally interviewed and examined the patient and have personally reviewed any abdominal/pelvic CT scan images.  My additional thoughts are as follows:  I actually suspect that this is right-sided diverticular bleeding, but agree that it is difficult to be certain.  I would like to give her until tomorrow  to see if it stops on its own before determining timing of endoscopic procedures.  When that occurs, will most likely do both EGD and colonoscopy. Meanwhile, support with blood products as needed and hold anti-platelet agents.  Nelida Meuse III Pager [phone]  Mon-Fri 8a-5p [phone] after 5p,  weekends, holidays

## 2016-06-17 NOTE — Care Management Note (Signed)
Case Management Note  Patient Details  Name: [name] Kim MRN: [id number] Date of Birth: March 19, 1929  Subjective/Objective:         Patient from home, lives with son in mobile home. Has BSC, 3/1, walker. Weak on admission, GIB, GI consulting. CM requested PT eval, pending.            Action/Plan:  CM will continue to follow for DC planning needs.  Expected Discharge Date:                  Expected Discharge Plan:     In-House Referral:     Discharge planning Services  CM Consult  Post Acute Care Choice:    Choice offered to:     DME Arranged:    DME Agency:     HH Arranged:    HH Agency:     Status of Service:  In process, will continue to follow  If discussed at Long Length of Stay Meetings, dates discussed:    Additional Comments:  Carles Collet, RN 06/17/2016, 11:34 AM

## 2016-06-17 NOTE — NC FL2 (Signed)
  [place] LEVEL OF CARE SCREENING TOOL     IDENTIFICATION  Patient Name: [name] Kim Birthdate: 02-23-1929 Sex: female Admission Date (Current Location): 06/16/2016  [facility] and [place] Number:  Herbalist and Address:  [facility] [place] [address] [place], [place] [address]      Provider Number: [id number]  Attending Physician Name and Address:  Mendel Corning, MD  Relative Name and Phone Number:       Current Level of Care: Hospital Recommended Level of Care: [place] Prior Approval Number:    Date Approved/Denied:   PASRR Number: [id number]  Discharge Plan: SNF    Current Diagnoses: Patient Active Problem List   Diagnosis Date Noted  . Acute blood loss anemia 06/16/2016  . Hematochezia 06/16/2016  . GIB (gastrointestinal bleeding) 06/16/2016  . Occluded coronary artery stent   . Diabetes mellitus, type 2 ([place])   . Endometrial cancer ([place])   . Hypertension   . Blindness   . CVA 2008 08/22/2006    Orientation RESPIRATION BLADDER Height & Weight     Self, Time, Situation, Place  Normal Continent Weight: 74.8 kg (164 lb 14.5 oz) Height:  5\' 4"  (162.6 cm)  BEHAVIORAL SYMPTOMS/MOOD NEUROLOGICAL BOWEL NUTRITION STATUS      Continent Diet (Please see DC Summary)  AMBULATORY STATUS COMMUNICATION OF NEEDS Skin   Extensive Assist Verbally Normal                       Personal Care Assistance Level of Assistance  Bathing, Feeding, Dressing Bathing Assistance: Maximum assistance Feeding assistance: Independent Dressing Assistance: Limited assistance     Functional Limitations Info             SPECIAL CARE FACTORS FREQUENCY  PT (By licensed PT)     PT Frequency: 5x/week              Contractures      Additional Factors Info  Code Status, Allergies Code Status Info: Full Allergies Info: Not on file           Current Medications (06/17/2016):  This is the current  hospital active medication list Current Facility-Administered Medications  Medication Dose Route Frequency Provider Last Rate Last Dose  . 0.9 %  sodium chloride infusion  250 mL Intravenous PRN Phillips Grout, MD      . 0.9 %  sodium chloride infusion   Intravenous Continuous Ripudeep Krystal Eaton, MD 100 mL/hr at 06/17/16 0853    . insulin aspart (novoLOG) injection 0-9 Units  0-9 Units Subcutaneous Q4H Phillips Grout, MD   2 Units at 06/16/16 2308  . pantoprazole (PROTONIX) injection 40 mg  40 mg Intravenous Q12H Vena Rua, PA-C   40 mg at 06/17/16 1037  . sodium chloride flush (NS) 0.9 % injection 3 mL  3 mL Intravenous Q12H Phillips Grout, MD      . sodium chloride flush (NS) 0.9 % injection 3 mL  3 mL Intravenous Q12H Phillips Grout, MD   3 mL at 06/16/16 2247  . sodium chloride flush (NS) 0.9 % injection 3 mL  3 mL Intravenous PRN Phillips Grout, MD         Discharge Medications: Please see discharge summary for a list of discharge medications.  Relevant Imaging Results:  Relevant Lab Results:   Additional Information SSN: [place], [place]

## 2016-06-18 LAB — HEMOGLOBIN AND HEMATOCRIT, BLOOD
HCT: 24.1 % — ABNORMAL LOW (ref 36.0–46.0)
HCT: 23.5 % — ABNORMAL LOW (ref 36.0–46.0)
HEMOGLOBIN: 8.1 g/dL — AB (ref 12.0–15.0)
HEMOGLOBIN: 7.9 g/dL — AB (ref 12.0–15.0)

## 2016-06-18 LAB — GLUCOSE, CAPILLARY
GLUCOSE-CAPILLARY: 111 mg/dL — AB (ref 65–99)
GLUCOSE-CAPILLARY: 127 mg/dL — AB (ref 65–99)
Glucose-Capillary: 105 mg/dL — ABNORMAL HIGH (ref 65–99)
Glucose-Capillary: 106 mg/dL — ABNORMAL HIGH (ref 65–99)
Glucose-Capillary: 125 mg/dL — ABNORMAL HIGH (ref 65–99)
Glucose-Capillary: 126 mg/dL — ABNORMAL HIGH (ref 65–99)
Glucose-Capillary: 99 mg/dL (ref 65–99)

## 2016-06-18 MED ORDER — PEG-KCL-NACL-NASULF-NA ASC-C 100 G PO SOLR
0.5000 | Freq: Once | ORAL | Status: AC
  Administered 2016-06-18: 100 g via ORAL
  Filled 2016-06-18: qty 1

## 2016-06-18 MED ORDER — PEG-KCL-NACL-NASULF-NA ASC-C 100 G PO SOLR: 0.5000 | Freq: Once | ORAL | Status: DC

## 2016-06-18 MED ORDER — METOCLOPRAMIDE HCL 5 MG/ML IJ SOLN
10.0000 mg | Freq: Once | INTRAMUSCULAR | Status: AC
  Administered 2016-06-18: 10 mg via INTRAVENOUS
  Filled 2016-06-18: qty 2

## 2016-06-18 MED ORDER — METOCLOPRAMIDE HCL 5 MG/ML IJ SOLN: 10.0000 mg | Freq: Once | INTRAMUSCULAR | Status: DC

## 2016-06-18 MED ORDER — METOCLOPRAMIDE HCL 5 MG/ML IJ SOLN
10.0000 mg | Freq: Once | INTRAMUSCULAR | Status: AC
  Administered 2016-06-19: 10 mg via INTRAVENOUS
  Filled 2016-06-18: qty 2

## 2016-06-18 MED ORDER — SODIUM CHLORIDE 0.9 % IV SOLN
INTRAVENOUS | Status: DC
  Administered 2016-06-18: 23:00:00 via INTRAVENOUS

## 2016-06-18 MED ORDER — LORAZEPAM 1 MG PO TABS
1.0000 mg | ORAL_TABLET | Freq: Three times a day (TID) | ORAL | Status: DC | PRN
  Administered 2016-06-18 – 2016-06-19 (×2): 1 mg via ORAL
  Filled 2016-06-18 (×2): qty 1

## 2016-06-18 MED ORDER — PEG-KCL-NACL-NASULF-NA ASC-C 100 G PO SOLR
0.5000 | Freq: Once | ORAL | Status: AC
  Administered 2016-06-19: 100 g via ORAL

## 2016-06-18 MED ORDER — ONDANSETRON HCL 4 MG/2ML IJ SOLN: 4.0000 mg | Freq: Four times a day (QID) | INTRAMUSCULAR | Status: DC | PRN

## 2016-06-18 MED ORDER — PANTOPRAZOLE SODIUM 40 MG PO TBEC
40.0000 mg | DELAYED_RELEASE_TABLET | Freq: Two times a day (BID) | ORAL | Status: DC
  Administered 2016-06-18 – 2016-06-20 (×5): 40 mg via ORAL
  Filled 2016-06-18 (×5): qty 1

## 2016-06-18 MED ORDER — PEG-KCL-NACL-NASULF-NA ASC-C 100 G PO SOLR
0.5000 | Freq: Once | ORAL | Status: DC
  Filled 2016-06-18: qty 1

## 2016-06-18 MED ORDER — BISACODYL 5 MG PO TBEC
10.0000 mg | DELAYED_RELEASE_TABLET | Freq: Once | ORAL | Status: AC
  Administered 2016-06-18: 10 mg via ORAL
  Filled 2016-06-18: qty 2

## 2016-06-18 MED ORDER — PEG-KCL-NACL-NASULF-NA ASC-C 100 G PO SOLR: 1.0000 | Freq: Once | ORAL | Status: DC

## 2016-06-18 MED ORDER — BISACODYL 5 MG PO TBEC
10.0000 mg | DELAYED_RELEASE_TABLET | Freq: Once | ORAL | Status: AC
  Administered 2016-06-18: 10 mg via ORAL
  Filled 2016-06-18 (×2): qty 2

## 2016-06-18 NOTE — Progress Notes (Signed)
[facility]                                                                              Patient Demographics  [name] Kim, is a 82 y.o. female, DOB - 02/05/1929, [id number]  Admit date - 06/16/2016   Admitting Physician Phillips Grout, MD  Outpatient Primary MD for the patient is Rochel Brome, MD  Outpatient specialists:   LOS - 2  days    Chief Complaint  Patient presents with  . GI Bleeding       Brief summary   Patient is 82 year old female with history of CVA on plavix/aspirin, DM, HTN, occluded coronary stent presented after passing out several times on the day of admission with rectal bleeding.  Pt reported she had several bowel movements with "a lot of blood in the toilet" that was bright red.  She denied any abdominal pain, tried to get up and got very weak and passed out. She has no h/o GIB in the past.  On arrival to the ED her HR was in the 130s and sbp around 90.  Pt was given 500cc ivf bolus with 1 unit packed RBC transfusion  GI on call was called for consultation. Hemoglobin was 6.5 at the time of admission.   Assessment & Plan   GI bleed with acute blood loss anemia, hemoglobin 6.5 at the time of admission, orthostatic - Feeling better this morning, continue gentle hydration - received 1 unit packed RBC transfusion 10/27, H&H stable for now - GI consulted, recommended IV PPI, endoscopy timings to be determined by GI - Hold aspirin and Plavix  CAD. Occluded coronary artery stent - For now hold aspirin and Plavix  History of CVA 2008 - Continue to hold aspirin and Plavix for now  Diabetes mellitus - Currently on full liquids, sliding scale insulin  Hypertension - Currently hypotensive, improving continue IV fluids  Code Status:Full CODE STATUS  DVT Prophylaxis: SCD's Family Communication: Discussed in detail with the patient, all imaging results, lab results explained to the patient  Disposition Plan:   Time Spent in minutes  25  minutes  Procedures:    Consultants:   GI   Antimicrobials:      Medications  Scheduled Meds: . insulin aspart  0-9 Units Subcutaneous Q4H  . pantoprazole  40 mg Oral BID  . sodium chloride flush  3 mL Intravenous Q12H  . sodium chloride flush  3 mL Intravenous Q12H   Continuous Infusions: . sodium chloride 100 mL/hr at 06/18/16 0357   PRN Meds:.sodium chloride, ondansetron (ZOFRAN) IV, sodium chloride flush   Antibiotics   Anti-infectives    None        Subjective:   Shamekka Wilhelmy was seen and examined today. Feeling somewhat better today, no fevers or chills. Overnight small amount of bleeding. Patient denies dizziness, chest pain, shortness of breath, abdominal pain, N/V/D/C.    Objective:   Vitals:   06/17/16 0802 06/17/16 1452 06/17/16 2055 06/18/16 0530  BP: (!) 127/56 (!) 135/46 (!) 121/51 (!) 116/49  Pulse: (!) 129 (!) 111 (!) 109 94  Resp:  18 18 18   Temp:  98.9  F (37.2 C) 98.7 F (37.1 C) 98.3 F (36.8 C)  TempSrc:  Oral Oral Oral  SpO2: 100% 99% 97% 98%  Weight:      Height:        Intake/Output Summary (Last 24 hours) at 06/18/16 0957 Last data filed at 06/18/16 0551  Gross per 24 hour  Intake          3084.99 ml  Output              300 ml  Net          2784.99 ml     Wt Readings from Last 3 Encounters:  06/16/16 74.8 kg (164 lb 14.5 oz)     Exam  General: Alert and oriented x 3, NAD  HEENT:    Neck: Supple, no JVD  Cardiovascular: S1 S2 auscultated, no rubs, murmurs or gallops. Regular rate and rhythm.  Respiratory: Clear to auscultation bilaterally, no wheezing, rales or rhonchi  Gastrointestinal: Soft, nontender, nondistended, + bowel sounds  Ext: no cyanosis clubbing or edema  Neuro:No new deficits  Skin: No rashes  Psych: Normal affect and demeanor, alert and oriented x3    Data Reviewed:  I have personally reviewed following labs and imaging studies  Micro Results No results found for this or any  previous visit (from the past 240 hour(s)).  Radiology Reports Dg Chest Portable 1 View  Result Date: 06/16/2016 CLINICAL DATA:  GI bleeding.  Nonproductive cough EXAM: PORTABLE CHEST 1 VIEW COMPARISON:  05/05/2016 FINDINGS: Prominent left ventricle. Negative aortic and hilar contours. There is no edema, consolidation, effusion, or pneumothorax. IMPRESSION: Stable from prior.  No evidence of acute disease. Electronically Signed   By: Monte Fantasia M.D.   On: 06/16/2016 16:21    Lab Data:  CBC:  Recent Labs Lab 06/16/16 1702 06/16/16 1715 06/17/16 0540 06/18/16 0812  WBC  --  10.2 9.7  --   HGB 6.5* 7.1* 8.8* 8.1*  HCT 19.0* 22.0* 25.5* 24.1*  MCV  --  93.6 88.5  --   PLT  --  356 262  --    Basic Metabolic Panel:  Recent Labs Lab 06/16/16 1702 06/16/16 1717 06/17/16 0540  NA 137 134* 137  K 4.5 4.5 3.9  CL 103 105 107  CO2  --  23 22  GLUCOSE 141* 146* 125*  BUN 23* 23* 24*  CREATININE 0.60 0.66 0.68  CALCIUM  --  7.5* 7.7*   GFR: Estimated Creatinine Clearance: 50.9 mL/min (by C-G formula based on SCr of 0.68 mg/dL). Liver Function Tests:  Recent Labs Lab 06/16/16 1717  AST 14*  ALT 10*  ALKPHOS 72  BILITOT 0.4  PROT 4.5*  ALBUMIN 2.4*   No results for input(s): LIPASE, AMYLASE in the last 168 hours. No results for input(s): AMMONIA in the last 168 hours. Coagulation Profile:  Recent Labs Lab 06/16/16 1715  INR 1.35   Cardiac Enzymes: No results for input(s): CKTOTAL, CKMB, CKMBINDEX, TROPONINI in the last 168 hours. BNP (last 3 results) No results for input(s): PROBNP in the last 8760 hours. HbA1C: No results for input(s): HGBA1C in the last 72 hours. CBG:  Recent Labs Lab 06/17/16 1222 06/17/16 1706 06/17/16 2054 06/18/16 0001 06/18/16 0412  GLUCAP 118* 125* 132* 99 105*   Lipid Profile: No results for input(s): CHOL, HDL, LDLCALC, TRIG, CHOLHDL, LDLDIRECT in the last 72 hours. Thyroid Function Tests: No results for input(s):  TSH, T4TOTAL, FREET4, T3FREE, THYROIDAB in the last 72 hours. Anemia  Panel: No results for input(s): VITAMINB12, FOLATE, FERRITIN, TIBC, IRON, RETICCTPCT in the last 72 hours. Urine analysis:    Component Value Date/Time   COLORURINE YELLOW 06/16/2016 1706   APPEARANCEUR CLEAR 06/16/2016 1706   LABSPEC 1.022 06/16/2016 1706   PHURINE 6.0 06/16/2016 1706   GLUCOSEU NEGATIVE 06/16/2016 1706   HGBUR NEGATIVE 06/16/2016 1706   BILIRUBINUR SMALL (A) 06/16/2016 1706   KETONESUR NEGATIVE 06/16/2016 1706   PROTEINUR NEGATIVE 06/16/2016 1706   UROBILINOGEN 0.2 01/28/2011 1639   NITRITE NEGATIVE 06/16/2016 1706   LEUKOCYTESUR NEGATIVE 06/16/2016 1706     RAI,RIPUDEEP M.D. [facility] 06/18/2016, 9:57 AM  Pager: [phone] Between 7am to 7pm - call Pager - [phone]  After 7pm go to [email or web address] - password [email or web address]  Call night coverage person covering after 7pm

## 2016-06-18 NOTE — Progress Notes (Signed)
Patient complaining of anxiety and states she was given ativan at the nursing home and that helped. Home med list states she takes valium. Patient stated "the valium is too strong and I normally cut it in half at home, but I want ativan". MD paged to make aware of patient's complaints.

## 2016-06-18 NOTE — Clinical Social Work Note (Signed)
Clinical Social Worker continuing to follow patient and family for support and discharge planning needs.  CSW spoke with patient at bedside to provide bed offers.  Patient has chosen a bed at [facility] available Monday.  Patient plans to update son of discharge plans.  CSW remains available for support and to facilitate patient discharge needs once medically stable.  Barbette Or, LCSW (Weekend Coverage) [phone]

## 2016-06-18 NOTE — Progress Notes (Signed)
Daily Rounding Note  06/18/2016, 8:16 AM  LOS: 2 days   SUBJECTIVE:   Chief complaint: hematochezia   Asking for something for her nerves.  Uses Valium 1/2 tablet prn at home.  Nauseated but no vomiting.  Appetite poor, on full liquid diet.   Smaller BM yesterday afternoon, not sure of color.  Denies abdominal pain  OBJECTIVE:         Vital signs in last 24 hours:    Temp:  [98.3 F (36.8 C)-98.9 F (37.2 C)] 98.3 F (36.8 C) (10/28 0530) Pulse Rate:  [94-111] 94 (10/28 0530) Resp:  [18] 18 (10/28 0530) BP: (116-135)/(46-51) 116/49 (10/28 0530) SpO2:  [97 %-99 %] 98 % (10/28 0530) Last BM Date: 06/17/16 Filed Weights   06/16/16 2108  Weight: 74.8 kg (164 lb 14.5 oz)   General: comfortable, a bit anxious, looks unwell   Heart: RRR.   Chest: clear bil.  Reduced BS overall.  No dyspnea or cough Abdomen: soft, NT, ND.  BS hypoactive.   Extremities: no CCE.   Neuro/Psych:  Cooperative.  Alert/oriented x 3.  No tremor.  Seems worried and perhaps depressed.   Intake/Output from previous day: 10/27 0701 - 10/28 0700 In: 3325 [P.O.:1260; I.V.:2065] Out: 400 [Urine:400]  Intake/Output this shift: No intake/output data recorded.  Lab Results:  Recent Labs  06/16/16 1702 06/16/16 1715 06/17/16 0540  WBC  --  10.2 9.7  HGB 6.5* 7.1* 8.8*  HCT 19.0* 22.0* 25.5*  PLT  --  356 262   BMET  Recent Labs  06/16/16 1702 06/16/16 1717 06/17/16 0540  NA 137 134* 137  K 4.5 4.5 3.9  CL 103 105 107  CO2  --  23 22  GLUCOSE 141* 146* 125*  BUN 23* 23* 24*  CREATININE 0.60 0.66 0.68  CALCIUM  --  7.5* 7.7*   LFT  Recent Labs  06/16/16 1717  PROT 4.5*  ALBUMIN 2.4*  AST 14*  ALT 10*  ALKPHOS 72  BILITOT 0.4   PT/INR  Recent Labs  06/16/16 1715  LABPROT 16.8*  INR 1.35   Hepatitis Panel No results for input(s): HEPBSAG, HCVAB, HEPAIGM, HEPBIGM in the last 72 hours.  Studies/Results: Dg Chest  Portable 1 View  Result Date: 06/16/2016 CLINICAL DATA:  GI bleeding.  Nonproductive cough EXAM: PORTABLE CHEST 1 VIEW COMPARISON:  05/05/2016 FINDINGS: Prominent left ventricle. Negative aortic and hilar contours. There is no edema, consolidation, effusion, or pneumothorax. IMPRESSION: Stable from prior.  No evidence of acute disease. Electronically Signed   By: Monte Fantasia M.D.   On: 06/16/2016 16:21   Scheduled Meds: . insulin aspart  0-9 Units Subcutaneous Q4H  . pantoprazole (PROTONIX) IV  40 mg Intravenous Q12H  . sodium chloride flush  3 mL Intravenous Q12H  . sodium chloride flush  3 mL Intravenous Q12H   Continuous Infusions: . sodium chloride 100 mL/hr at 06/18/16 0357   PRN Meds:.sodium chloride, sodium chloride flush   ASSESMENT:   *  GI bleed. ? UGI or colonic source. Rule out ulcer, rule out AVMs. Rule out neoplasia. Hx colon polypectomies in 04/2015.   *  Chronic Plavix for hx CVA.  On hold, last dose morning of 06/15/16.  *  Acute blood loss anemia. Excellent response to PRBC x 2.     PLAN   *  Added Zofran prn.  Switch to po Protonix.  *  Arrange colon/EGD for tomorrow, hopefully will tolerated  prep, will give Reglan with this.    *  Hospitalist to address Rx of anxiety.  Benzos may help her nausea.     Azucena Freed  06/18/2016, 8:16 AM Pager: [phone]  I have discussed the case with the PA, and that is the plan I formulated. I personally interviewed and examined the patient.  She is stable.  Bleeding appears to have stopped.  Source unclear.  EGD /colonoscopy tomorrow.  She is agreeable.  The benefits and risks of the planned procedure were described in detail with the patient or (when appropriate) their health care proxy.  Risks were outlined as including, but not limited to, bleeding, infection, perforation, adverse medication reaction leading to cardiac or pulmonary decompensation, or pancreatitis (if ERCP).  The limitation of incomplete mucosal  visualization was also discussed.  No guarantees or warranties were given.    Nelida Meuse III Pager [phone]  Mon-Fri 8a-5p [phone] after 5p, weekends, holidays

## 2016-06-19 ENCOUNTER — Encounter (HOSPITAL_COMMUNITY)
Admission: EM | Disposition: A | Discharge status: Skilled Nursing Facility | Payer: Self-pay | Source: Home / Self Care | Attending: Internal Medicine

## 2016-06-19 ENCOUNTER — Encounter (HOSPITAL_COMMUNITY): Payer: Self-pay | Admitting: *Deleted

## 2016-06-19 DIAGNOSIS — E118 Type 2 diabetes mellitus with unspecified complications: Secondary | ICD-10-CM

## 2016-06-19 HISTORY — PX: ESOPHAGOGASTRODUODENOSCOPY: SHX5428

## 2016-06-19 HISTORY — PX: COLONOSCOPY: SHX5424

## 2016-06-19 LAB — HEMOGLOBIN AND HEMATOCRIT, BLOOD
HEMATOCRIT: 25.4 % — AB (ref 36.0–46.0)
HEMOGLOBIN: 8.6 g/dL — AB (ref 12.0–15.0)

## 2016-06-19 LAB — CBC
HCT: 23.2 % — ABNORMAL LOW (ref 36.0–46.0)
Hemoglobin: 7.6 g/dL — ABNORMAL LOW (ref 12.0–15.0)
MCH: 30.6 pg (ref 26.0–34.0)
MCHC: 32.8 g/dL (ref 30.0–36.0)
MCV: 93.5 fL (ref 78.0–100.0)
PLATELETS: 258 10*3/uL (ref 150–400)
RBC: 2.48 MIL/uL — AB (ref 3.87–5.11)
RDW: 16.5 % — AB (ref 11.5–15.5)
WBC: 7.1 10*3/uL (ref 4.0–10.5)

## 2016-06-19 LAB — GLUCOSE, CAPILLARY
GLUCOSE-CAPILLARY: 139 mg/dL — AB (ref 65–99)
GLUCOSE-CAPILLARY: 105 mg/dL — AB (ref 65–99)
GLUCOSE-CAPILLARY: 131 mg/dL — AB (ref 65–99)
Glucose-Capillary: 157 mg/dL — ABNORMAL HIGH (ref 65–99)
Glucose-Capillary: 115 mg/dL — ABNORMAL HIGH (ref 65–99)

## 2016-06-19 LAB — TYPE AND SCREEN
ABO/RH(D): O POS
ANTIBODY SCREEN: NEGATIVE

## 2016-06-19 LAB — PREPARE RBC (CROSSMATCH)

## 2016-06-19 SURGERY — COLONOSCOPY
Anesthesia: Moderate Sedation

## 2016-06-19 MED ORDER — DIPHENHYDRAMINE HCL 50 MG/ML IJ SOLN
INTRAMUSCULAR | Status: AC
  Filled 2016-06-19: qty 1

## 2016-06-19 MED ORDER — SODIUM CHLORIDE 0.9 % IV SOLN
Freq: Once | INTRAVENOUS | Status: AC
  Administered 2016-06-19: 07:00:00 via INTRAVENOUS

## 2016-06-19 MED ORDER — BUTAMBEN-TETRACAINE-BENZOCAINE 2-2-14 % EX AERO
INHALATION_SPRAY | CUTANEOUS | Status: DC | PRN
  Administered 2016-06-19: 1 via TOPICAL

## 2016-06-19 MED ORDER — MIDAZOLAM HCL 5 MG/5ML IJ SOLN
INTRAMUSCULAR | Status: DC | PRN
  Administered 2016-06-19 (×2): 1 mg via INTRAVENOUS

## 2016-06-19 MED ORDER — MIDAZOLAM HCL 5 MG/ML IJ SOLN
INTRAMUSCULAR | Status: AC
  Filled 2016-06-19: qty 2

## 2016-06-19 MED ORDER — FENTANYL CITRATE (PF) 100 MCG/2ML IJ SOLN
INTRAMUSCULAR | Status: DC | PRN
  Administered 2016-06-19: 25 ug via INTRAVENOUS

## 2016-06-19 MED ORDER — FENTANYL CITRATE (PF) 100 MCG/2ML IJ SOLN
INTRAMUSCULAR | Status: AC
  Filled 2016-06-19: qty 2

## 2016-06-19 NOTE — Progress Notes (Signed)
[facility]                                                                              Patient Demographics  [name] Kim, is a 82 y.o. female, DOB - [birth year], [id number]  Admit date - 06/16/2016   Admitting Physician Phillips Grout, MD  Outpatient Primary MD for the patient is Rochel Brome, MD  Outpatient specialists:   LOS - 3  days    Chief Complaint  Patient presents with  . GI Bleeding       Brief summary   Patient is 82 year old female with history of CVA on plavix/aspirin, DM, HTN, occluded coronary stent presented after passing out several times on the day of admission with rectal bleeding.  Pt reported she had several bowel movements with "a lot of blood in the toilet" that was bright red.  She denied any abdominal pain, tried to get up and got very weak and passed out. She has no h/o GIB in the past.  On arrival to the ED her HR was in the 130s and sbp around 90.  Pt was given 500cc ivf bolus with 1 unit packed RBC transfusion  GI on call was called for consultation. Hemoglobin was 6.5 at the time of admission.   Assessment & Plan   GI bleed with acute blood loss anemia, hemoglobin 6.5 at the time of admission, orthostatic - received 1 unit packed RBC transfusion 10/27 - GI consulted, recommended IV PPI - EGD normal, colonoscopy with diverticulosis in sigmoid colon, no active bleeding. - Hold aspirin and Plavix. Per GI, resume Plavix in 10 days. - Hemoglobin 7.9 today, we will transfuse 1 unit of packed RBCs  CAD. Occluded coronary artery stent - For now hold aspirin and Plavix  History of CVA 2008 - Continue to hold aspirin and Plavix for now  Diabetes mellitus -Restarted diet  Hypertension - Currently hypotensive, improving continue IV fluids  Code Status:Full CODE STATUS  DVT Prophylaxis: SCD's Family Communication: Discussed in detail with the patient, all imaging results, lab results explained to the patient  Disposition Plan:  DC home in a.m. if H&H stable and no acute issues overnight  Time Spent in minutes  25 minutes  Procedures:  EGD, colonoscopy  Consultants:   GI   Antimicrobials:      Medications  Scheduled Meds: . sodium chloride   Intravenous Once  . insulin aspart  0-9 Units Subcutaneous Q4H  . pantoprazole  40 mg Oral BID  . sodium chloride flush  3 mL Intravenous Q12H  . sodium chloride flush  3 mL Intravenous Q12H   Continuous Infusions:   PRN Meds:.sodium chloride, LORazepam, ondansetron (ZOFRAN) IV, sodium chloride flush   Antibiotics   Anti-infectives    None        Subjective:   [name] Kim was seen and examined today. No acute complaints. Patient denies dizziness, chest pain, shortness of breath, abdominal pain, N/V/D/C.    Objective:   Vitals:   06/19/16 0930 06/19/16 0935 06/19/16 0940 06/19/16 0945  BP: (!) 125/44 (!) 123/33 (!) 122/29 (!) 153/50  Pulse: (!) 109 (!) 107 (!) 108 (!) 112  Resp: (!) 23 (!) 24 (!) 24 16  Temp:      TempSrc:      SpO2: 100% 100% 100% 100%  Weight:      Height:        Intake/Output Summary (Last 24 hours) at 06/19/16 1019 Last data filed at 06/19/16 0353  Gross per 24 hour  Intake             1883 ml  Output                0 ml  Net             1883 ml     Wt Readings from Last 3 Encounters:  06/16/16 74.8 kg (164 lb 14.5 oz)     Exam  General: Alert and oriented x 3, NAD  HEENT:    Neck: Supple, no JVD  Cardiovascular: S1 S2 clear, RRR  Respiratory: CTAB  Gastrointestinal: Soft, NT, ND, NBS  Ext: no cyanosis clubbing or edema  Neuro:No new deficits  Skin: No rashes  Psych: Normal affect and demeanor, alert and oriented x3    Data Reviewed:  I have personally reviewed following labs and imaging studies  Micro Results No results found for this or any previous visit (from the past 240 hour(s)).  Radiology Reports Dg Chest Portable 1 View  Result Date: 06/16/2016 CLINICAL DATA:  GI bleeding.   Nonproductive cough EXAM: PORTABLE CHEST 1 VIEW COMPARISON:  05/05/2016 FINDINGS: Prominent left ventricle. Negative aortic and hilar contours. There is no edema, consolidation, effusion, or pneumothorax. IMPRESSION: Stable from prior.  No evidence of acute disease. Electronically Signed   By: Monte Fantasia M.D.   On: 06/16/2016 16:21    Lab Data:  CBC:  Recent Labs Lab 06/16/16 1715 06/17/16 0540 06/18/16 0812 06/18/16 1546 06/19/16 0635  WBC 10.2 9.7  --   --  7.1  HGB 7.1* 8.8* 8.1* 7.9* 7.6*  HCT 22.0* 25.5* 24.1* 23.5* 23.2*  MCV 93.6 88.5  --   --  93.5  PLT 356 262  --   --  0000000   Basic Metabolic Panel:  Recent Labs Lab 06/16/16 1702 06/16/16 1717 06/17/16 0540  NA 137 134* 137  K 4.5 4.5 3.9  CL 103 105 107  CO2  --  23 22  GLUCOSE 141* 146* 125*  BUN 23* 23* 24*  CREATININE 0.60 0.66 0.68  CALCIUM  --  7.5* 7.7*   GFR: Estimated Creatinine Clearance: 50.9 mL/min (by C-G formula based on SCr of 0.68 mg/dL). Liver Function Tests:  Recent Labs Lab 06/16/16 1717  AST 14*  ALT 10*  ALKPHOS 72  BILITOT 0.4  PROT 4.5*  ALBUMIN 2.4*   No results for input(s): LIPASE, AMYLASE in the last 168 hours. No results for input(s): AMMONIA in the last 168 hours. Coagulation Profile:  Recent Labs Lab 06/16/16 1715  INR 1.35   Cardiac Enzymes: No results for input(s): CKTOTAL, CKMB, CKMBINDEX, TROPONINI in the last 168 hours. BNP (last 3 results) No results for input(s): PROBNP in the last 8760 hours. HbA1C: No results for input(s): HGBA1C in the last 72 hours. CBG:  Recent Labs Lab 06/18/16 1622 06/18/16 2008 06/18/16 2343 06/19/16 0339 06/19/16 0809  GLUCAP 106* 127* 111* 139* 105*   Lipid Profile: No results for input(s): CHOL, HDL, LDLCALC, TRIG, CHOLHDL, LDLDIRECT in the last 72 hours. Thyroid Function Tests: No results for input(s): TSH, T4TOTAL, FREET4, T3FREE, THYROIDAB in the last 72 hours. Anemia Panel:  No results for input(s):  VITAMINB12, FOLATE, FERRITIN, TIBC, IRON, RETICCTPCT in the last 72 hours. Urine analysis:    Component Value Date/Time   COLORURINE YELLOW 06/16/2016 1706   APPEARANCEUR CLEAR 06/16/2016 1706   LABSPEC 1.022 06/16/2016 1706   PHURINE 6.0 06/16/2016 1706   GLUCOSEU NEGATIVE 06/16/2016 1706   HGBUR NEGATIVE 06/16/2016 1706   BILIRUBINUR SMALL (A) 06/16/2016 1706   KETONESUR NEGATIVE 06/16/2016 1706   PROTEINUR NEGATIVE 06/16/2016 1706   UROBILINOGEN 0.2 01/28/2011 1639   NITRITE NEGATIVE 06/16/2016 1706   LEUKOCYTESUR NEGATIVE 06/16/2016 1706     Berlin Viereck M.D. [facility] 06/19/2016, 10:19 AM  Pager: [phone] Between 7am to 7pm - call Pager - [phone]  After 7pm go to [email or web address] - password [email or web address]  Call night coverage person covering after 7pm

## 2016-06-19 NOTE — H&P (View-Only) (Signed)
Daily Rounding Note  06/18/2016, 8:16 AM  LOS: 2 days   SUBJECTIVE:   Chief complaint: hematochezia   Asking for something for her nerves.  Uses Valium 1/2 tablet prn at home.  Nauseated but no vomiting.  Appetite poor, on full liquid diet.   Smaller BM yesterday afternoon, not sure of color.  Denies abdominal pain  OBJECTIVE:         Vital signs in last 24 hours:    Temp:  [98.3 F (36.8 C)-98.9 F (37.2 C)] 98.3 F (36.8 C) (10/28 0530) Pulse Rate:  [94-111] 94 (10/28 0530) Resp:  [18] 18 (10/28 0530) BP: (116-135)/(46-51) 116/49 (10/28 0530) SpO2:  [97 %-99 %] 98 % (10/28 0530) Last BM Date: 06/17/16 Filed Weights   06/16/16 2108  Weight: 74.8 kg (164 lb 14.5 oz)   General: comfortable, a bit anxious, looks unwell   Heart: RRR.   Chest: clear bil.  Reduced BS overall.  No dyspnea or cough Abdomen: soft, NT, ND.  BS hypoactive.   Extremities: no CCE.   Neuro/Psych:  Cooperative.  Alert/oriented x 3.  No tremor.  Seems worried and perhaps depressed.   Intake/Output from previous day: 10/27 0701 - 10/28 0700 In: 3325 [P.O.:1260; I.V.:2065] Out: 400 [Urine:400]  Intake/Output this shift: No intake/output data recorded.  Lab Results:  Recent Labs  06/16/16 1702 06/16/16 1715 06/17/16 0540  WBC  --  10.2 9.7  HGB 6.5* 7.1* 8.8*  HCT 19.0* 22.0* 25.5*  PLT  --  356 262   BMET  Recent Labs  06/16/16 1702 06/16/16 1717 06/17/16 0540  NA 137 134* 137  K 4.5 4.5 3.9  CL 103 105 107  CO2  --  23 22  GLUCOSE 141* 146* 125*  BUN 23* 23* 24*  CREATININE 0.60 0.66 0.68  CALCIUM  --  7.5* 7.7*   LFT  Recent Labs  06/16/16 1717  PROT 4.5*  ALBUMIN 2.4*  AST 14*  ALT 10*  ALKPHOS 72  BILITOT 0.4   PT/INR  Recent Labs  06/16/16 1715  LABPROT 16.8*  INR 1.35   Hepatitis Panel No results for input(s): HEPBSAG, HCVAB, HEPAIGM, HEPBIGM in the last 72 hours.  Studies/Results: Dg Chest  Portable 1 View  Result Date: 06/16/2016 CLINICAL DATA:  GI bleeding.  Nonproductive cough EXAM: PORTABLE CHEST 1 VIEW COMPARISON:  05/05/2016 FINDINGS: Prominent left ventricle. Negative aortic and hilar contours. There is no edema, consolidation, effusion, or pneumothorax. IMPRESSION: Stable from prior.  No evidence of acute disease. Electronically Signed   By: Monte Fantasia M.D.   On: 06/16/2016 16:21   Scheduled Meds: . insulin aspart  0-9 Units Subcutaneous Q4H  . pantoprazole (PROTONIX) IV  40 mg Intravenous Q12H  . sodium chloride flush  3 mL Intravenous Q12H  . sodium chloride flush  3 mL Intravenous Q12H   Continuous Infusions: . sodium chloride 100 mL/hr at 06/18/16 0357   PRN Meds:.sodium chloride, sodium chloride flush   ASSESMENT:   *  GI bleed. ? UGI or colonic source. Rule out ulcer, rule out AVMs. Rule out neoplasia. Hx colon polypectomies in 04/2015.   *  Chronic Plavix for hx CVA.  On hold, last dose morning of 06/15/16.  *  Acute blood loss anemia. Excellent response to PRBC x 2.     PLAN   *  Added Zofran prn.  Switch to po Protonix.  *  Arrange colon/EGD for tomorrow, hopefully will tolerated  prep, will give Reglan with this.    *  Hospitalist to address Rx of anxiety.  Benzos may help her nausea.     Azucena Freed  06/18/2016, 8:16 AM Pager: [phone]  I have discussed the case with the PA, and that is the plan I formulated. I personally interviewed and examined the patient.  She is stable.  Bleeding appears to have stopped.  Source unclear.  EGD /colonoscopy tomorrow.  She is agreeable.  The benefits and risks of the planned procedure were described in detail with the patient or (when appropriate) their health care proxy.  Risks were outlined as including, but not limited to, bleeding, infection, perforation, adverse medication reaction leading to cardiac or pulmonary decompensation, or pancreatitis (if ERCP).  The limitation of incomplete mucosal  visualization was also discussed.  No guarantees or warranties were given.    Nelida Meuse III Pager [phone]  Mon-Fri 8a-5p [phone] after 5p, weekends, holidays

## 2016-06-19 NOTE — Interval H&P Note (Signed)
History and Physical Interval Note:  06/19/2016 9:02 AM  [name] Kim  has presented today for surgery, with the diagnosis of GI bleed and anemia.  The various methods of treatment have been discussed with the patient and family. After consideration of risks, benefits and other options for treatment, the patient has consented to  Procedure(s): COLONOSCOPY (N/A) ESOPHAGOGASTRODUODENOSCOPY (EGD) (N/A) as a surgical intervention .  The patient's history has been reviewed, patient examined, no change in status, stable for surgery.  I have reviewed the patient's chart and labs.  Questions were answered to the patient's satisfaction.     [name] Kim III

## 2016-06-19 NOTE — Op Note (Signed)
[facility] Patient Name: [name] Kim Procedure Date : 06/19/2016 MRN: [id number] Attending MD: Estill Cotta. Loletha Carrow , MD Date of Birth: 06-07-1929 CSN: [id number] Age: 82 Admit Type: Inpatient Procedure:                Upper GI endoscopy Indications:              Hematochezia Providers:                Mallie Mussel L. Loletha Carrow, MD, Elna Breslow, RN, Ralene Bathe,                            Technician Referring MD:              Medicines:                Midazolam 3 mg IV, Sedation Administered by an                            Endoscopy Nurse Complications:            No immediate complications. Estimated Blood Loss:     Estimated blood loss: none. Procedure:                Pre-Anesthesia Assessment:                           - Prior to the procedure, a History and Physical                            was performed, and patient medications and                            allergies were reviewed. The patient's tolerance of                            previous anesthesia was also reviewed. The risks                            and benefits of the procedure and the sedation                            options and risks were discussed with the patient.                            All questions were answered, and informed consent                            was obtained. Prior Anticoagulants: The patient has                            taken Plavix (clopidogrel), last dose was 3 days                            prior to procedure. ASA Grade Assessment: III - A  patient with severe systemic disease. After                            reviewing the risks and benefits, the patient was                            deemed in satisfactory condition to undergo the                            procedure.                           After obtaining informed consent, the endoscope was                            passed under direct vision. Throughout the                            procedure,  the patient's blood pressure, pulse, and                            oxygen saturations were monitored continuously. The                            [id number]) scope was introduced through the                            mouth, and advanced to the second part of duodenum.                            The upper GI endoscopy was accomplished without                            difficulty. The patient tolerated the procedure                            well. Scope In: Scope Out: Findings:      The esophagus was normal.      The stomach was normal.      The cardia and gastric fundus were normal on retroflexion.      The examined duodenum was normal. Impression:               - Normal esophagus.                           - Normal stomach.                           - Normal examined duodenum.                           - No specimens collected. Moderate Sedation:      Moderate (conscious) sedation was administered by the endoscopy nurse       and supervised by the endoscopist. The following parameters were       monitored: oxygen saturation, heart rate, blood pressure,  respiratory       rate, EKG, adequacy of pulmonary ventilation, and response to care.       Total physician intraservice time was 7 minutes. Recommendation:           - See the other procedure note for documentation of                            additional recommendations.                           - Resume regular diet.                           - Continue present medications. Procedure Code(s):        --- Professional ---                           [phone], Esophagogastroduodenoscopy, flexible,                            transoral; diagnostic, including collection of                            specimen(s) by brushing or washing, when performed                            (separate procedure) Diagnosis Code(s):        --- Professional ---                           K92.1, Melena (includes Hematochezia) CPT copyright 2016  American [employer]. All rights reserved. The codes documented in this report are preliminary and upon coder review may  be revised to meet current compliance requirements. Henry L. Loletha Carrow, MD 06/19/2016 9:47:17 AM This report has been signed electronically. Number of Addenda: 0

## 2016-06-19 NOTE — Op Note (Signed)
[facility] Patient Name: [name] Kim Procedure Date : 06/19/2016 MRN: [id number] Attending MD: Estill Cotta. Loletha Carrow , MD Date of Birth: 1929-06-21 CSN: [id number] Age: 82 Admit Type: Inpatient Procedure:                Colonoscopy Indications:              Hematochezia Providers:                Mallie Mussel L. Loletha Carrow, MD, Elna Breslow, RN, Ralene Bathe,                            Technician Referring MD:              Medicines:                Fentanyl 25 micrograms IV, See the other procedure                            note for documentation of the administered                            medications Complications:            No immediate complications. Estimated Blood Loss:     Estimated blood loss: none. Procedure:                Pre-Anesthesia Assessment:                           - Prior to the procedure, a History and Physical                            was performed, and patient medications and                            allergies were reviewed. The patient's tolerance of                            previous anesthesia was also reviewed. The risks                            and benefits of the procedure and the sedation                            options and risks were discussed with the patient.                            All questions were answered, and informed consent                            was obtained. Prior Anticoagulants: The patient has                            taken Plavix (clopidogrel), last dose was 3 days                            prior to procedure.  ASA Grade Assessment: III - A                            patient with severe systemic disease. After                            reviewing the risks and benefits, the patient was                            deemed in satisfactory condition to undergo the                            procedure.                           - Prior to the procedure, a History and Physical                            was performed, and  patient medications and                            allergies were reviewed. The patient's tolerance of                            previous anesthesia was also reviewed. The risks                            and benefits of the procedure and the sedation                            options and risks were discussed with the patient.                            All questions were answered, and informed consent                            was obtained. Prior Anticoagulants: The patient has                            taken Plavix (clopidogrel), last dose was 3 days                            prior to procedure. ASA Grade Assessment: III - A                            patient with severe systemic disease. After                            reviewing the risks and benefits, the patient was                            deemed in satisfactory condition to undergo the  procedure.                           After obtaining informed consent, the colonoscope                            was passed under direct vision. Throughout the                            procedure, the patient's blood pressure, pulse, and                            oxygen saturations were monitored continuously. The                            [id number]) scope was introduced through                            the anus and advanced to the the cecum, identified                            by appendiceal orifice and ileocecal valve. The                            colonoscopy was performed without difficulty. The                            patient tolerated the procedure. The quality of the                            bowel preparation was poor. The ileocecal valve,                            appendiceal orifice, and rectum were photographed.                            The bowel preparation used was MoviPrep. The                            quality of the bowel preparation was evaluated                             using the BBPS [facility] Bowel Preparation Scale)                            with scores of: Right Colon = 1, Transverse Colon =                            1 and Left Colon = 1 Scope In: 9:24:32 AM Scope Out: 9:34:45 AM Scope Withdrawal Time: 0 hours 6 minutes 5 seconds  Total Procedure Duration: 0 hours 10 minutes 13 seconds  Findings:      The digital rectal exam findings include decreased sphincter tone.      Multiple small-mouthed diverticula were found in the  sigmoid colon.      The exam was otherwise without abnormality on direct and retroflexion       views, limited by poor preparation. Impression:               - Preparation of the colon was fair.                           - Decreased sphincter tone found on digital rectal                            exam.                           - Diverticulosis in the sigmoid colon. Suspected to                            have been the source of bleeding that has now                            stopped.                           - The examination was otherwise normal on direct                            and retroflexion views.                           - No specimens collected. Moderate Sedation:      Moderate (conscious) sedation was administered by the endoscopy nurse       and supervised by the endoscopist. The following parameters were       monitored: oxygen saturation, heart rate, blood pressure, respiratory       rate, EKG, adequacy of pulmonary ventilation, and response to care.       Total physician intraservice time was 14 minutes. Recommendation:           - Resume regular diet.                           - Continue present medications.                           - No recommendation at this time regarding repeat                            colonoscopy due to age.                           If it is determined that the patient should                            continue plavix, it can be resumed in 10 days. Procedure Code(s):        ---  Professional ---                           [phone], Colonoscopy, flexible;  diagnostic, including                            collection of specimen(s) by brushing or washing,                            when performed (separate procedure)                           99152, Moderate sedation services provided by the                            same physician or other qualified health care                            professional performing the diagnostic or                            therapeutic service that the sedation supports,                            requiring the presence of an independent trained                            observer to assist in the monitoring of the                            patient's level of consciousness and physiological                            status; initial 15 minutes of intraservice time,                            patient age 17 years or older Diagnosis Code(s):        --- Professional ---                           K62.89, Other specified diseases of anus and rectum                           K92.1, Melena (includes Hematochezia)                           K57.30, Diverticulosis of large intestine without                            perforation or abscess without bleeding CPT copyright 2016 American Medical Association. All rights reserved. The codes documented in this report are preliminary and upon coder review may  be revised to meet current compliance requirements. Henry L. Loletha Carrow, MD 06/19/2016 10:01:14 AM This report has been signed electronically. Number of Addenda: 0

## 2016-06-20 DIAGNOSIS — T82897S Other specified complication of cardiac prosthetic devices, implants and grafts, sequela: Secondary | ICD-10-CM | POA: Diagnosis not present

## 2016-06-20 DIAGNOSIS — K5731 Diverticulosis of large intestine without perforation or abscess with bleeding: Secondary | ICD-10-CM | POA: Diagnosis not present

## 2016-06-20 DIAGNOSIS — E785 Hyperlipidemia, unspecified: Secondary | ICD-10-CM | POA: Diagnosis not present

## 2016-06-20 DIAGNOSIS — T82855D Stenosis of coronary artery stent, subsequent encounter: Secondary | ICD-10-CM | POA: Diagnosis not present

## 2016-06-20 DIAGNOSIS — R262 Difficulty in walking, not elsewhere classified: Secondary | ICD-10-CM | POA: Diagnosis not present

## 2016-06-20 DIAGNOSIS — H547 Unspecified visual loss: Secondary | ICD-10-CM | POA: Diagnosis not present

## 2016-06-20 DIAGNOSIS — M6281 Muscle weakness (generalized): Secondary | ICD-10-CM | POA: Diagnosis not present

## 2016-06-20 DIAGNOSIS — I10 Essential (primary) hypertension: Secondary | ICD-10-CM | POA: Diagnosis not present

## 2016-06-20 DIAGNOSIS — K5791 Diverticulosis of intestine, part unspecified, without perforation or abscess with bleeding: Secondary | ICD-10-CM | POA: Diagnosis not present

## 2016-06-20 DIAGNOSIS — C541 Malignant neoplasm of endometrium: Secondary | ICD-10-CM | POA: Diagnosis not present

## 2016-06-20 DIAGNOSIS — I639 Cerebral infarction, unspecified: Secondary | ICD-10-CM | POA: Diagnosis not present

## 2016-06-20 DIAGNOSIS — Z7982 Long term (current) use of aspirin: Secondary | ICD-10-CM | POA: Diagnosis not present

## 2016-06-20 DIAGNOSIS — D509 Iron deficiency anemia, unspecified: Secondary | ICD-10-CM | POA: Diagnosis not present

## 2016-06-20 DIAGNOSIS — D62 Acute posthemorrhagic anemia: Secondary | ICD-10-CM | POA: Diagnosis not present

## 2016-06-20 DIAGNOSIS — E1143 Type 2 diabetes mellitus with diabetic autonomic (poly)neuropathy: Secondary | ICD-10-CM | POA: Diagnosis not present

## 2016-06-20 DIAGNOSIS — Z8719 Personal history of other diseases of the digestive system: Secondary | ICD-10-CM | POA: Diagnosis not present

## 2016-06-20 DIAGNOSIS — E1149 Type 2 diabetes mellitus with other diabetic neurological complication: Secondary | ICD-10-CM | POA: Diagnosis not present

## 2016-06-20 DIAGNOSIS — M25552 Pain in left hip: Secondary | ICD-10-CM | POA: Diagnosis not present

## 2016-06-20 DIAGNOSIS — K921 Melena: Secondary | ICD-10-CM | POA: Diagnosis not present

## 2016-06-20 DIAGNOSIS — Z8673 Personal history of transient ischemic attack (TIA), and cerebral infarction without residual deficits: Secondary | ICD-10-CM | POA: Diagnosis not present

## 2016-06-20 DIAGNOSIS — D649 Anemia, unspecified: Secondary | ICD-10-CM | POA: Diagnosis not present

## 2016-06-20 LAB — HEMOGLOBIN AND HEMATOCRIT, BLOOD
HCT: 27 % — ABNORMAL LOW (ref 36.0–46.0)
Hemoglobin: 8.9 g/dL — ABNORMAL LOW (ref 12.0–15.0)

## 2016-06-20 LAB — TYPE AND SCREEN
ABO/RH(D): O POS
Antibody Screen: NEGATIVE
UNIT DIVISION: 0
Unit division: 0
Unit division: 0

## 2016-06-20 LAB — GLUCOSE, CAPILLARY
GLUCOSE-CAPILLARY: 117 mg/dL — AB (ref 65–99)
GLUCOSE-CAPILLARY: 134 mg/dL — AB (ref 65–99)
Glucose-Capillary: 124 mg/dL — ABNORMAL HIGH (ref 65–99)
Glucose-Capillary: 129 mg/dL — ABNORMAL HIGH (ref 65–99)

## 2016-06-20 MED ORDER — PANTOPRAZOLE SODIUM 40 MG PO TBEC: 40.0000 mg | DELAYED_RELEASE_TABLET | Freq: Two times a day (BID) | ORAL | 0 refills | Status: AC

## 2016-06-20 MED ORDER — LORAZEPAM 1 MG PO TABS: 1.0000 mg | ORAL_TABLET | Freq: Three times a day (TID) | ORAL | 0 refills | Status: AC | PRN

## 2016-06-20 MED ORDER — CLOPIDOGREL BISULFATE 75 MG PO TABS: 75.0000 mg | ORAL_TABLET | Freq: Every day | ORAL | Status: AC

## 2016-06-20 MED ORDER — ACETAMINOPHEN 325 MG PO TABS: 650.0000 mg | ORAL_TABLET | ORAL | Status: DC | PRN

## 2016-06-20 NOTE — Clinical Social Work Placement (Signed)
   CLINICAL SOCIAL WORK PLACEMENT  NOTE  Date:  06/20/2016  Patient Details  Name: [name] Kim MRN: [id number] Date of Birth: 11/04/1928  Clinical Social Work is seeking post-discharge placement for this patient at the [place] level of care (*CSW will initial, date and re-position this form in  chart as items are completed):  Yes   Patient/family provided with [facility] Work Department's list of facilities offering this level of care within the geographic area requested by the patient (or if unable, by the patient's family).  Yes   Patient/family informed of their freedom to choose among providers that offer the needed level of care, that participate in Medicare, Medicaid or managed care program needed by the patient, have an available bed and are willing to accept the patient.  Yes   Patient/family informed of [facility]'s ownership interest in [facility] and [facility], as well as of the fact that they are under no obligation to receive care at these facilities.  PASRR submitted to EDS on       PASRR number received on       Existing PASRR number confirmed on 06/17/16     FL2 transmitted to all facilities in geographic area requested by pt/family on 06/17/16     FL2 transmitted to all facilities within larger geographic area on       Patient informed that his/her managed care company has contracts with or will negotiate with certain facilities, including the following:        Yes   Patient/family informed of bed offers received.  Patient chooses bed at [facility] recommends and patient chooses bed at      Patient to be transferred to [facility] on 06/20/16.  Patient to be transferred to facility by ptar     Patient family notified on 06/20/16 of transfer.  Name of family member notified:  pt has notified son     PHYSICIAN Please sign FL2     Additional Comment:     _______________________________________________ Jorge Ny, LCSW 06/20/2016, 12:32 PM

## 2016-06-20 NOTE — Care Management Note (Signed)
Case Management Note  Patient Details  Name: CAMBRIDGE HAYMON MRN: [id number] Date of Birth: Sep 24, 1928  Subjective/Objective:        Admitted with GI bleed, anemia.            Action/Plan: Plan is to d/c to SNF today.  Expected Discharge Date:    06/20/2016              Expected Discharge Plan:     In-House Referral:   CSW, managing d/c disposition.  Discharge planning Services  CM Consult   Status of Service:  completed  If discussed at Long Length of Stay Meetings, dates discussed:    Additional Comments:  Sharin Mons, RN 06/20/2016, 10:51 AM

## 2016-06-20 NOTE — Progress Notes (Signed)
NURSING PROGRESS NOTE  ANYHA TONN [id number] Discharge Data: 06/20/2016 3:44 PM Attending Provider: Mendel Corning, MD PCP:COX,KIRSTEN, MD   Elmarie Mainland to be D/C'd Skilled nursing facility per MD order.    All IV's will be discontinued and monitored for bleeding.  All belongings will be returned to patient for patient to take home.  Last Documented Vital Signs:  Blood pressure (!) 146/57, pulse 78, temperature 98.2 F (36.8 C), temperature source Oral, resp. rate 18, height 5\' 4"  (1.626 m), weight 74.8 kg (164 lb 14.5 oz), SpO2 96 %.  Joslyn Hy, MSN, RN, [employer]

## 2016-06-20 NOTE — Care Management Important Message (Signed)
Important Message  Patient Details  Name: [name] Kim MRN: [id number] Date of Birth: [birth year]   Medicare Important Message Given:  Yes    [name] 06/20/2016, 5:17 PM

## 2016-06-20 NOTE — Progress Notes (Signed)
Patient will discharge to [facility]  Anticipated discharge date: 10/30 Family notified: pt notified son Transportation by [employer]- scheduled for 2:15pm  CSW signing off.  Jorge Ny, LCSW Clinical Social Worker [phone]

## 2016-06-20 NOTE — Discharge Summary (Signed)
Physician Discharge Summary   Patient ID: [name] Kim MRN: [id number] DOB/AGE: [birth year] 80 y.o.  Admit date: 06/16/2016 Discharge date: 06/20/2016  Primary Care Physician:  [name] Brome, MD  Discharge Diagnoses:    . Acute blood loss anemia   Lower GI bleed, diverticular . Hypertension . Endometrial cancer ([place]) . CVA 2008 . Occluded coronary artery stent . Hematochezia   Consults:  Gastroenterology, Dr. Loletha Kim   Recommendations for Outpatient Follow-up:  1. Please hold Plavix for 10 days and then resume per GI recommendations 2. Please repeat CBC/BMET at next visit   DIET: Heart healthy diet    Allergies:  Not on File   DISCHARGE MEDICATIONS: Current Discharge Medication List    START taking these medications   Details  LORazepam (ATIVAN) 1 MG tablet Take 1 tablet (1 mg total) by mouth every 8 (eight) hours as needed for anxiety. Qty: 20 tablet, Refills: 0    pantoprazole (PROTONIX) 40 MG tablet Take 1 tablet (40 mg total) by mouth 2 (two) times daily. Qty: 60 tablet, Refills: 0      CONTINUE these medications which have CHANGED   Details  clopidogrel (PLAVIX) 75 MG tablet Take 1 tablet (75 mg total) by mouth at bedtime. Hold for 10days      CONTINUE these medications which have NOT CHANGED   Details  acetaminophen (TYLENOL ARTHRITIS PAIN) 650 MG CR tablet Take 650 mg by mouth every 6 (six) hours as needed for pain.    amLODipine (NORVASC) 5 MG tablet Take 5 mg by mouth daily.    aspirin 81 MG tablet Take 81 mg by mouth daily.    atorvastatin (LIPITOR) 10 MG tablet Take 10 mg by mouth daily at 6 PM.    gabapentin (NEURONTIN) 300 MG capsule Take 300 mg by mouth 3 (three) times daily.    valsartan (DIOVAN) 320 MG tablet Take 320 mg by mouth daily.      STOP taking these medications     diazepam (VALIUM) 5 MG tablet      omeprazole (PRILOSEC) 20 MG capsule          Brief H and P: For complete details please refer to admission H and P,  but in brief Patient is 82 year old female with history of CVA on plavix/aspirin, DM, HTN, occluded coronary stent presented after passing out several times on the day of admission with rectal bleeding. Pt reported she had several bowel movements with "a lot of blood in the toilet" that was bright red. She denied any abdominal pain, tried to get up and got very weak and passed out. She has no h/o GIB in the past. On arrival to the ED her HR was in the 130s and sbp around 90. Pt was given 500cc ivf bolus with 1 unit packed RBC transfusion GI on call was called for consultation. Hemoglobin was 6.5 at the time of admission.  Hospital Course:  Lower GI bleed/diverticular with acute blood loss anemia, hemoglobin 6.5 at the time of admission, orthostatic - received 2 unit packed RBC transfusion 10/27 per EDP notes, patient received another packed RBC transfusion on 10/29 - GI consulted, recommended IV PPI - EGD normal, colonoscopy with diverticulosis in sigmoid colon, no active bleeding at the time of endoscopy. - I verified with the patient, she was on aspirin 81 mg daily with Plavix prior to admission, for her "heart" and history of stroke. Per GI, resume Plavix in 10 days. Will resume aspirin at discharge. Patient remains at high risk for  bleeding given dual antiplatelet agents. I strongly recommend close outpatient follow-up and determine need for dual antiplatelet agent by her cardiologist and neurologist. - Hemoglobin 7.9 on 10/29 and hence patient was transfused one more unit of packed RBC, hemoglobin 8.9 at the time of discharge.   CAD. Occluded coronary artery stent - Per patient she has been on dual antiplatelet agent aspirin and Plavix prior to admission for her "heart" and prior history of stroke. Patient can resume aspirin at discharge however continue to hold Plavix for another 10 days. She needs to have close follow-up with her cardiologist to determine need for dual antiplatelet agent  given her high risk for diverticular bleeding in future.  History of CVA 2008 - Can resume aspirin at discharge but hold Plavix for another 10 days  Diabetes mellitus -Restarted diet  Hypertension  - Improved   Day of Discharge BP (!) 146/57 (BP Location: Right Arm)   Pulse 78   Temp 98.2 F (36.8 C) (Oral)   Resp 18   Ht 5\' 4"  (1.626 m)   Wt 74.8 kg (164 lb 14.5 oz)   SpO2 96%   BMI 28.31 kg/m   Physical Exam: General: Alert and awake oriented x3 not in any acute distress. HEENT: anicteric sclera, pupils reactive to light and accommodation CVS: S1-S2 clear no murmur rubs or gallops Chest: clear to auscultation bilaterally, no wheezing rales or rhonchi Abdomen: soft nontender, nondistended, normal bowel sounds Extremities: no cyanosis, clubbing or edema noted bilaterally Neuro: Cranial nerves II-XII intact, no focal neurological deficits   The results of significant diagnostics from this hospitalization (including imaging, microbiology, ancillary and laboratory) are listed below for reference.    LAB RESULTS: Basic Metabolic Panel:  Recent Labs Lab 06/16/16 1717 06/17/16 0540  NA 134* 137  K 4.5 3.9  CL 105 107  CO2 23 22  GLUCOSE 146* 125*  BUN 23* 24*  CREATININE 0.66 0.68  CALCIUM 7.5* 7.7*   Liver Function Tests:  Recent Labs Lab 06/16/16 1717  AST 14*  ALT 10*  ALKPHOS 72  BILITOT 0.4  PROT 4.5*  ALBUMIN 2.4*   No results for input(s): LIPASE, AMYLASE in the last 168 hours. No results for input(s): AMMONIA in the last 168 hours. CBC:  Recent Labs Lab 06/17/16 0540  06/19/16 0635 06/19/16 1859 06/20/16 0625  WBC 9.7  --  7.1  --   --   HGB 8.8*  < > 7.6* 8.6* 8.9*  HCT 25.5*  < > 23.2* 25.4* 27.0*  MCV 88.5  --  93.5  --   --   PLT 262  --  258  --   --   < > = values in this interval not displayed. Cardiac Enzymes: No results for input(s): CKTOTAL, CKMB, CKMBINDEX, TROPONINI in the last 168 hours. BNP: Invalid input(s):  POCBNP CBG:  Recent Labs Lab 06/20/16 0407 06/20/16 0745  GLUCAP 124* 117*    Significant Diagnostic Studies:  Dg Chest Portable 1 View  Result Date: 06/16/2016 CLINICAL DATA:  GI bleeding.  Nonproductive cough EXAM: PORTABLE CHEST 1 VIEW COMPARISON:  05/05/2016 FINDINGS: Prominent left ventricle. Negative aortic and hilar contours. There is no edema, consolidation, effusion, or pneumothorax. IMPRESSION: Stable from prior.  No evidence of acute disease. Electronically Signed   By: Monte Fantasia M.D.   On: 06/16/2016 16:21    2D ECHO:   Disposition and Follow-up: Discharge Instructions    Diet Carb Modified    Complete by:  As directed  Increase activity slowly    Complete by:  As directed        DISPOSITION: Skilled nursing facility   DISCHARGE FOLLOW-UP Follow-up Information    COX,KIRSTEN, MD. Schedule an appointment as soon as possible for a visit in 2 week(s).   Specialties:  Family Medicine, Interventional Cardiology, Radiology, Anesthesiology Contact information: [place] [address] [place], [place]. Schedule an appointment as soon as possible for a visit in 2 week(s).   Specialty:  Gastroenterology Contact information: [address] [address] [phone]            Time spent on Discharge: 35 minutes  Signed:   RAI,RIPUDEEP M.D. [facility] 06/20/2016, 10:38 AM Pager: [phone]

## 2016-06-21 ENCOUNTER — Encounter: Payer: Self-pay | Admitting: Internal Medicine

## 2016-06-21 ENCOUNTER — Non-Acute Institutional Stay (SKILLED_NURSING_FACILITY): Payer: Medicare Other | Admitting: Internal Medicine

## 2016-06-21 DIAGNOSIS — E1149 Type 2 diabetes mellitus with other diabetic neurological complication: Secondary | ICD-10-CM | POA: Diagnosis not present

## 2016-06-21 DIAGNOSIS — T82897S Other specified complication of cardiac prosthetic devices, implants and grafts, sequela: Secondary | ICD-10-CM

## 2016-06-21 DIAGNOSIS — K5791 Diverticulosis of intestine, part unspecified, without perforation or abscess with bleeding: Secondary | ICD-10-CM | POA: Diagnosis not present

## 2016-06-21 DIAGNOSIS — I10 Essential (primary) hypertension: Secondary | ICD-10-CM | POA: Diagnosis not present

## 2016-06-21 DIAGNOSIS — I639 Cerebral infarction, unspecified: Secondary | ICD-10-CM

## 2016-06-21 LAB — CBC AND DIFFERENTIAL
HEMATOCRIT: 31 % — AB (ref 36–46)
Hemoglobin: 9.9 g/dL — AB (ref 12.0–16.0)
PLATELETS: 359 10*3/uL (ref 150–399)
WBC: 7.1 10^3/mL

## 2016-06-21 LAB — BASIC METABOLIC PANEL
BUN: 9 mg/dL (ref 4–21)
Creatinine: 0.7 mg/dL (ref 0.5–1.1)
Glucose: 129 mg/dL
Potassium: 3 mmol/L — AB (ref 3.4–5.3)
SODIUM: 141 mmol/L (ref 137–147)

## 2016-06-21 NOTE — Assessment & Plan Note (Addendum)
Verify with PCP

## 2016-06-21 NOTE — Progress Notes (Signed)
Facility Location: [facility]  Room Number: [id number]  Code Status: Full Code   PCP: Rochel Brome, MD [address] [place] [address]    This is a comprehensive admission note to [facility] performed on this date less than 30 days from date of admission. Included are preadmission medical/surgical history;reconciled medication list; family history; social history and comprehensive review of systems.  Corrections and additions to the records were documented . Comprehensive physical exam was also performed. Additionally a clinical summary was entered for each active diagnosis pertinent to this admission in the Problem List to enhance continuity of care.   HPI: The patient was hospitalized 10/26-10/30/17 with acute blood loss anemia due to a diverticular GI bleed. She had a history of stroke and was on Plavix and aspirin.She presented after several episodes of syncope the day of admission in the context of gross rectal bleeding. She had bright red in the toilet water which was described as "a lot". There was no associated abdominal pain In the Emergency room heart rate was in the [id number] and systolic blood pressure 90. She received 500 mL IV bolus and 1 unit of packed cells. Hemoglobin was 6.5.  EGD was normal; colonoscopy revealed diverticulosis in the sigmoid colon without active bleeding at the time of endoscopy. GI prescribed Protonix 40 mg twice a day and recommended holding the Plavix for 10 days. Aspirin was reinitiated @ the time of discharge. Assessment by her  neurologist was recommended to ascertain the need for dual therapy in the context of her high risk of recurrent GI bleed.  She received a total of 3 units of packed cells during the hospitalization. Hemoglobin was 8.9 at time of discharge.   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 Diabetes is being treated with diet.  Last A1c in Epic was 6.2% on 01/28/11. She is on gabapentin for peripheral neuropathy. She has past history of endometrial cancer. She is on a statin for dyslipidemia. She had a partial left hip replacement in September  Social history: Nonsmoker and nondrinker  Family history: Heart attacks in both paternal grandparents  Review of systems: She denies any active GI symptoms or bleeding. She has chronic dry eyes. 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 She has occasional headaches. She describes exertional dyspnea She has diffuse arthritis, particularly in the left shoulder and left upper extremity related to previous trauma.  She describes anxiety.   Constitutional: No fever,significant weight change, fatigue  Eyes: No redness, discharge, pain, new vision change ENT/mouth: No nasal congestion,  purulent discharge, earache,change in hearing ,sore throat  Cardiovascular: No chest pain, palpitations,paroxysmal nocturnal dyspnea, claudication, edema  Respiratory: No cough, sputum production,hemoptysis,  significant snoring,apnea   Gastrointestinal: No heartburn,dysphagia,abdominal pain, nausea / vomiting,rectal bleeding, melena,change in bowels Genitourinary: No dysuria,hematuria, pyuria,  incontinence, nocturia Dermatologic: No rash, pruritus, change in appearance of skin Neurologic: No dizziness,recurrent syncope, seizures, numbness , tingling Psychiatric: No  insomnia, anorexia Endocrine: No change in hair/skin/ nails, excessive thirst, excessive hunger, excessive urination  Hematologic/lymphatic: No significant bruising, lymphadenopathy,abnormal bleeding Allergy/immunology: No itchy/ watery eyes, significant sneezing, urticaria, angioedema  Physical exam:  Pertinent or positive findings: she is edentulous.  She has no vision on the right. She has osteoarthritic changes in the hands. Pedal pulses are slightly decreased. She has insignificant trace edema of ankles.   General  appearance:Adequately nourished; no acute distress , increased  work of breathing is present.   Lymphatic: No lymphadenopathy about the head, neck, axilla  Eyes: No conjunctival inflammation or lid edema is present. There is no scleral icterus. Ears:  External ear exam shows no significant lesions or deformities.   Nose:  External nasal examination shows no deformity or inflammation. Nasal mucosa are pink and moist without lesions ,exudates Oral exam: lips and gums are healthy appearing.There is no oropharyngeal erythema or exudate . Neck:  No thyromegaly, masses, tenderness noted.    Heart:  Normal rate and regular rhythm. S1 and S2 normal without gallop, murmur, click, rub .  Lungs:Chest clear to auscultation without wheezes, rhonchi,rales , rubs. Abdomen:Bowel sounds are normal. Abdomen is soft and nontender with no organomegaly, hernias,masses. GU: deferred. Extremities:  No cyanosis, clubbing  Neurologic exam : Strength equal  in upper & lower extremities Balance,Rhomberg,finger to nose testing could not be completed due to clinical state Deep tendon reflexes are equal Skin: Warm & dry w/o tenting. No significant lesions or rash.  See clinical summary under each active problem in the Problem List with associated updated therapeutic plan

## 2016-06-21 NOTE — Assessment & Plan Note (Signed)
Plavix will be held for at least 10 days. Neurology consultation prior to restarting seems indicated

## 2016-06-21 NOTE — Patient Instructions (Signed)
See Current Assessment & Plan in Problem List under specific Diagnosis 

## 2016-06-21 NOTE — Assessment & Plan Note (Signed)
Diet-controlled as per patient

## 2016-06-21 NOTE — Assessment & Plan Note (Signed)
Consult neurology as to risk: benefit of dual therapy with aspirin and Plavix

## 2016-06-21 NOTE — Assessment & Plan Note (Signed)
BP controlled; no change in antihypertensive medications  

## 2016-06-23 ENCOUNTER — Encounter: Payer: Self-pay | Admitting: *Deleted

## 2016-06-30 ENCOUNTER — Other Ambulatory Visit: Payer: Self-pay | Admitting: *Deleted

## 2016-06-30 NOTE — Patient Outreach (Signed)
[facility]) Care Management  06/30/2016  [name] Kim 28-May-1929 [id number]   Met with patient at bedside, discussed [facility] care management program. Patient denies any post discharge needs at this time.  Patient reports she lives with her son, who assists and she has a friend/caregiver who has been assisting her since 2008.  Patient denies any issues with medications or transportation. RNCM will sign off, but will remain available for additional [facility] program services consult needs arise. Royetta Crochet. Laymond Purser, RN, BSN, [facility] Care Coordinator [phone]

## 2016-07-05 ENCOUNTER — Other Ambulatory Visit (INDEPENDENT_AMBULATORY_CARE_PROVIDER_SITE_OTHER): Payer: Medicare Other

## 2016-07-05 ENCOUNTER — Ambulatory Visit (INDEPENDENT_AMBULATORY_CARE_PROVIDER_SITE_OTHER): Payer: Medicare Other | Admitting: Physician Assistant

## 2016-07-05 ENCOUNTER — Encounter: Payer: Self-pay | Admitting: Physician Assistant

## 2016-07-05 VITALS — BP 150/62 | HR 88

## 2016-07-05 DIAGNOSIS — D649 Anemia, unspecified: Secondary | ICD-10-CM | POA: Diagnosis not present

## 2016-07-05 DIAGNOSIS — Z8719 Personal history of other diseases of the digestive system: Secondary | ICD-10-CM | POA: Diagnosis not present

## 2016-07-05 LAB — HEMOGLOBIN: Hemoglobin: 11.6 g/dL — ABNORMAL LOW (ref 12.0–15.0)

## 2016-07-05 NOTE — Progress Notes (Addendum)
Chief Complaint: Hospital follow up for diverticular bleeding, Anemia  HPI:   [name] Kim is an 82 year old female with a past medical history of diabetes, dyslipidemia, endometrial cancer, hypertension and peripheral neuropathy who was referred to me by [name] Brome, MD from [facility], for follow-up after recently being seen in the hospital for a diverticular bleed from 06/16/16 through 06/20/16.   Per chart review patient had a colonoscopy completed 06/19/16 by Dr. Loletha Carrow with findings of decreased sphincter tone, diverticulosis in the sigmoid colon suspected to have been the source of bleeding which had stopped and otherwise normal exam. EGD performed at that time showed a normal esophagus, stomach and duodenum. At that time patient was told to hold her Plavix for 10 days and then resume if deemed necessary and ASA was restarted the next day.   Patient's last CBC was completed 06/21/2016 and showed a hemoglobin of 9.9.    Per notes in patient's chart from Dr. Linna Darner it appears a neurology consultation has been requested prior to restarting Plavix to discuss risk versus benefit of dual therapy with aspirin and Plavix.   Today, the patient tells me that she "feels fine". According to the referring physician's notes, they request our help with Plavix and aspirin. According to the patient's MAR she did restart this medication 10 days after her colonoscopy as directed by Dr. Loletha Carrow, she took this medication for 8 days and then started to refuse this medication as she was unsure if she should still be on it after her GI bleed. Patient tells me that currently she is unsure if she is getting aspirin at all. According to her MAR she is not. She does ask some questions regarding these medications.    Patient denies fever, chills, blood in her stool, melena, change in bowel habits, dizziness, syncope, shortness of breath, heartburn, reflux or abdominal pain.   Past Medical History:    Diagnosis Date  . Blindness   . CVA (cerebral infarction) 2008  . Diabetes mellitus, type 2 ([place])   . Dyslipidemia   . Endometrial cancer ([place])   . Hypertension   . Occluded coronary artery stent    06/21/16 the patient denies any history of right artery disease with stenting  . Peripheral neuropathy [facility])     Past Surgical History:  Procedure Laterality Date  . COLONOSCOPY N/A 06/19/2016   Procedure: COLONOSCOPY;  Surgeon: Doran Stabler, MD;  Location: [facility] ENDOSCOPY;  Service: Endoscopy;  Laterality: N/A;  . ESOPHAGOGASTRODUODENOSCOPY N/A 06/19/2016   Procedure: ESOPHAGOGASTRODUODENOSCOPY (EGD);  Surgeon: Doran Stabler, MD;  Location: [facility] ENDOSCOPY;  Service: Endoscopy;  Laterality: N/A;  . hysterectomy    . JOINT REPLACEMENT  04/2016   left hip partial replacement    Current Outpatient Prescriptions  Medication Sig Dispense Refill  . acetaminophen (TYLENOL ARTHRITIS PAIN) 650 MG CR tablet Take 650 mg by mouth every 6 (six) hours as needed for pain.    Marland Kitchen amLODipine (NORVASC) 5 MG tablet Take 5 mg by mouth daily.    Marland Kitchen aspirin 81 MG tablet Take 81 mg by mouth daily.    Marland Kitchen atorvastatin (LIPITOR) 10 MG tablet Take 10 mg by mouth daily at 6 PM.    . clopidogrel (PLAVIX) 75 MG tablet Take 1 tablet (75 mg total) by mouth at bedtime. Hold for 10days (Patient not taking: Reported on 06/21/2016)    . gabapentin (NEURONTIN) 300 MG capsule Take 300 mg by mouth 3 (three) times daily.    Marland Kitchen  LORazepam (ATIVAN) 1 MG tablet Take 1 tablet (1 mg total) by mouth every 8 (eight) hours as needed for anxiety. 20 tablet 0  . pantoprazole (PROTONIX) 40 MG tablet Take 1 tablet (40 mg total) by mouth 2 (two) times daily. 60 tablet 0  . valsartan (DIOVAN) 320 MG tablet Take 320 mg by mouth daily.     No current facility-administered medications for this visit.     Allergies as of 07/05/2016  . (No Known Allergies)    Family History  Problem Relation Age of Onset  . Heart disease Paternal  Grandmother   . Heart disease Paternal Grandfather   . Cancer Neg Hx   . Stroke Neg Hx   . Diabetes Neg Hx     Social History   Social History  . Marital status: Widowed    Spouse name: N/A  . Number of children: N/A  . Years of education: N/A   Occupational History  . Not on file.   Social History Main Topics  . Smoking status: Never Smoker  . Smokeless tobacco: Never Used  . Alcohol use No  . Drug use: No  . Sexual activity: No   Other Topics Concern  . Not on file   Social History Narrative  . No narrative on file    Review of Systems:     Constitutional: No weight loss, fever, chills, weakness or fatigue HEENT: Eyes: No change in vision               Ears, Nose, Throat:  No change in hearing or congestion Skin: No rash or itching Cardiovascular: No chest pain, chest pressure or palpitations   Respiratory: No SOB or cough Gastrointestinal: See HPI and otherwise negative Genitourinary: No dysuria or change in urinary frequency Neurological: No headache, dizziness or syncope Musculoskeletal: No new muscle or joint pain Hematologic: No bleeding or bruising Psychiatric: No history of depression or anxiety   Physical Exam:  Vital signs: BP (!) 150/62 (BP Location: Left Arm, Patient Position: Sitting, Cuff Size: Normal)   Pulse 88    General:   Pleasant elderly Caucasian female appears to be in NAD, Well developed, Well nourished, alert and cooperative Head:  Normocephalic and atraumatic. Eyes:   PEERL, EOMI. No icterus. Conjunctiva pink. Ears:  Normal auditory acuity. Neck:  Supple Throat: Oral cavity and pharynx without inflammation, swelling or lesion.  Lungs: Respirations even and unlabored. Lungs clear to auscultation bilaterally.   No wheezes, crackles, or rhonchi.  Heart: Normal S1, S2. No MRG. Regular rate and rhythm. No peripheral edema, cyanosis or pallor.  Abdomen:  Soft, nondistended, nontender. No rebound or guarding. Normal bowel sounds. No  appreciable masses or hepatomegaly. Rectal:  Not performed.  Msk:  Symmetrical without gross deformities. Peripheral pulses intact.  Extremities:  Without edema, no deformity or joint abnormality. Ambulates in wheelchair Neurologic:  Alert and  oriented x4;  grossly normal neurologically.  Skin:   Dry and intact without significant lesions or rashes. Psychiatric: Oriented to person, place and time. Demonstrates good judgement and reason without abnormal affect or behaviors.  RELEVANT LABS AND IMAGING: CBC    Component Value Date/Time   WBC 7.1 06/21/2016   WBC 7.1 06/19/2016 0635   RBC 2.48 (L) 06/19/2016 0635   HGB 9.9 (A) 06/21/2016   HCT 31 (A) 06/21/2016   PLT 359 06/21/2016   MCV 93.5 06/19/2016 0635   MCH 30.6 06/19/2016 0635   MCHC 32.8 06/19/2016 0635   RDW 16.5 (H) 06/19/2016  [id number]   LYMPHSABS 2.8 01/28/2011 1649   MONOABS 1.0 01/28/2011 1649   EOSABS 0.1 01/28/2011 1649   BASOSABS 0.0 01/28/2011 1649    CMP     Component Value Date/Time   NA 141 06/21/2016   K 3.0 (A) 06/21/2016   CL 107 06/17/2016 0540   CO2 22 06/17/2016 0540   GLUCOSE 125 (H) 06/17/2016 0540   BUN 9 06/21/2016   CREATININE 0.7 06/21/2016   CREATININE 0.68 06/17/2016 0540   CALCIUM 7.7 (L) 06/17/2016 0540   PROT 4.5 (L) 06/16/2016 1717   ALBUMIN 2.4 (L) 06/16/2016 1717   AST 14 (L) 06/16/2016 1717   ALT 10 (L) 06/16/2016 1717   ALKPHOS 72 06/16/2016 1717   BILITOT 0.4 06/16/2016 1717   GFRNONAA >60 06/17/2016 0540   GFRAA >60 06/17/2016 0540    Assessment: 1. Anemia:Patient's last hemoglobin at time of discharge from the hospital was 9.9, no repeat labs since that time, no history of repeat GI bleeding, patient is currently off of her Plavix and aspirin which she was taking prior to GI bleed for history of stroke, apparently she did take these medications for a short period of time around 7 days after the 10 day wait period advised by Dr. Loletha Carrow, but then started to refuse these  medications. 2. Diverticular bleed: Stopped while patient was in the hospital 06/20/16  Plan: 1. Recheck hgb today. 2. Discussed with the patient that she should restart her Plavix and aspirin as she is at increased risk for stroke at the moment not being on any anticoagulation. Although, we are not the experts in this and she needs to be set up to see her neurologist and/or PCP in the very near future to discuss risks versus benefit for her remaining on dual therapy with history of recent GI bleed 3. Patient told me that she will take these medications for now and is aware of the risk of a repeat GI bleed 4. Patient to return to clinic as needed in the future with Dr. Leroy Kennedy, PA-C [facility] 07/05/2016, 11:30 AM  Cc: [name] Brome, MD   Thank you for sending this case to me.   There is no way of predicting or preventing recurrent GI bleed if she goes back on anti-platelet therapy.  Given her history of CVA, I feel that the risk/benefit ratio favors resuming anti-platelet therapy at this time.  Whether she should be on aspirin alone vs aspirin plus plavix is a decision bet made by her neurologist. If one agent is deemed sufficient, I do not have preference for which one.

## 2016-07-05 NOTE — Patient Instructions (Signed)
We drew one lab on you for the Hemoglobin today 07-05-2016. We will notify you with the results.  You should continue on the plavix and aspirin daily.

## 2016-07-07 ENCOUNTER — Non-Acute Institutional Stay (SKILLED_NURSING_FACILITY): Payer: Medicare Other | Admitting: Internal Medicine

## 2016-07-07 ENCOUNTER — Encounter: Payer: Self-pay | Admitting: Internal Medicine

## 2016-07-07 DIAGNOSIS — K5791 Diverticulosis of intestine, part unspecified, without perforation or abscess with bleeding: Secondary | ICD-10-CM

## 2016-07-07 DIAGNOSIS — D62 Acute posthemorrhagic anemia: Secondary | ICD-10-CM | POA: Diagnosis not present

## 2016-07-07 DIAGNOSIS — I10 Essential (primary) hypertension: Secondary | ICD-10-CM

## 2016-07-07 DIAGNOSIS — I639 Cerebral infarction, unspecified: Secondary | ICD-10-CM | POA: Diagnosis not present

## 2016-07-07 NOTE — Assessment & Plan Note (Signed)
BP controlled; no change in antihypertensive medications  

## 2016-07-07 NOTE — Assessment & Plan Note (Signed)
Neurology reevaluation to be scheduled by Dr. Tobie Poet, her PCP to assess ongoing need for dual therapy in the context of the recent diverticular bleed Statin will be continued

## 2016-07-07 NOTE — Progress Notes (Signed)
Facility Location: [facility]  Room Number: [id number]  Code Status: Full Code   The patient is being discharged from [facility] on 07/09/2016 by Unice Cobble MD.  PCP: Rochel Brome, MD, [place]   The medical history in this facility was reviewed and summarized and medical problem list was updated. Time spent and note content is documented as follows.  Summary of [facility] medical records: The patient was admitted to the [facility] 06/20/16 after being hospitalized 10/26-10/30 acute blood loss anemia due to diverticular bleed. This was in the context of Plavix and aspirin therapy because of history of stroke in 2008. Presentation was recurrent syncope in the context of gross rectal bleeding. Emergency room VS revealed pulse in [id number] and systolic blood pressure 90. She received 1 unit of packed cells & 500 mL IV bolus of fluids. At that time hemoglobin was 6.5. Dr Loletha Carrow, Gastroenterology performed EGD which was normal. Colonoscopy revealed diverticulosis in sigmoid colon without active bleeding. PPI  twice a day was prescribed. Plavix was held for 10 days. Aspirin was reinitiated at discharge 10/30. During the hospitalization she received a total of 3 units of packed cells. Hemoglobin was 8.9 @ discharge. Repeat hemoglobin at the SNF was 11.6 on 07/05/16.  Past history includes stroke in 2008 associated with blindness in the right eye. She is unable to remember the name of her neurologist.  She is treated for hyperglycemia with diet alone. Gabapentin was prescribed for peripheral neuropathy. She is on a statin. At the SNF she's had dark stools in the context of iron supplementation. She continues to have easy bruising. Is no specific bleeding dyscrasia otherwise.  Review of systems:Pertinent or active symptoms include:She has chronic dry eyes for which she uses a moisturizing agent.  With physical therapy exertional dyspnea has improved but  still is present to a slight degree.  She describes chronic pain in the left neck, shoulder, and arm related to a prior fall and arthritis.  She describes intermittent burning in the lower extremities, greater on the right.  She describes pain in the left hip when she tries to change position. She has a follow-up appointment with orthopedist follow up of recent hip surgery on 08/02/16.  Negative [id number]: No fever,significant weight change, fatigue  Eyes: No discharge, pain, vision change ENT/mouth: No nasal congestion,  purulent discharge, earache,change in hearing ,sore throat  Cardiovascular: No chest pain, palpitations,paroxysmal nocturnal dyspnea, claudication, edema  Respiratory: No cough, sputum production,hemoptysis, significant snoring,apnea  Gastrointestinal: No heartburn,dysphagia,abdominal pain, nausea / vomiting,rectal bleeding, melena,change in bowels Genitourinary: No dysuria,hematuria, pyuria,  incontinence, nocturia Dermatologic: No rash, pruritus, change in appearance of skin Neurologic: No dizziness,headache,syncope, seizures Psychiatric: No significant anxiety , depression, insomnia, anorexia Endocrine: No change in hair/skin/ nails, excessive thirst, excessive hunger, excessive urination  Hematologic/lymphatic: No significant lymphadenopathy,abnormal bleeding Allergy/immunology: No itchy/ watery eyes, significant sneezing, urticaria, angioedema  Physical exam:  Pertinent or positive findings:She has resting exotropia of the right eye. Minimal anisocoria is present. There is some erythema of the edges of the eyelids on the left. There is no conjunctivitis.  She has 1/2+ pitting edema. Pedal pulses are decreased. There is isolated bruising of the left hand. Reflexes are 0-1/2+ @ the knees and 1/2+ in the upper extremities.  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    General appearance:Adequately nourished; no acute distress , increased work of  breathing is present.   Lymphatic: No lymphadenopathy about the head, neck, axilla .  Eyes: No conjunctival inflammation or lid edema is present. There is no scleral icterus. Ears:  External ear exam shows no significant lesions or deformities.   Nose:  External nasal examination shows no deformity or inflammation. Nasal mucosa are pink and moist without lesions ,exudates Oral exam: lips and gums are healthy appearing.There is no oropharyngeal erythema or exudate . Neck:  No thyromegaly, masses, tenderness noted.    Heart:  Normal rate and regular rhythm. S1 and S2 normal without gallop, murmur, click, rub .  Lungs:Chest clear to auscultation without wheezes, rhonchi,rales , rubs. Abdomen:Bowel sounds are normal. Abdomen is soft and nontender with no organomegaly, hernias,masses. GU: deferred Extremities:  No cyanosis, clubbing  Neurologic exam : Strength equal  in upper & lower extremities Skin: Warm & dry w/o tenting. No significant lesions or rash.  See clinical summary of Discharge Diagnoses in the Problem List with associated updated therapeutic plan  Discharge instructions were written and discharge instructions provided. Follow-up will be by the primary care physician in seven - 10 days.

## 2016-07-07 NOTE — Assessment & Plan Note (Signed)
See recommendations under acute blood loss anemia

## 2016-07-07 NOTE — Assessment & Plan Note (Signed)
She will continue the Plavix and aspirin; reassessment by neurology has been recommended to weigh the risk:benefits of continuing the dual therapy.Her primary care physician can arrange the neurology follow-up. She knows to stop the Plavix and aspirin should she have any rectal bleeding.

## 2016-07-07 NOTE — Patient Instructions (Addendum)
Stop Plavix and aspirin should you have any rectal bleeding. Neurology consultation may be scheduled by Dr. Tobie Poet to reassess need for both agents to prevent recurrent stroke. Please see Dr. Tobie Poet in 7-10 days following discharge.  External eye cleansing as discussed with lather of mild shampoo.After 10 seconds wash off lather . Make sure that all residual soap is removed to prevent irritation.

## 2016-08-02 DIAGNOSIS — Z96649 Presence of unspecified artificial hip joint: Secondary | ICD-10-CM | POA: Diagnosis not present

## 2016-08-02 DIAGNOSIS — S72009A Fracture of unspecified part of neck of unspecified femur, initial encounter for closed fracture: Secondary | ICD-10-CM | POA: Diagnosis not present

## 2016-08-30 DIAGNOSIS — E1143 Type 2 diabetes mellitus with diabetic autonomic (poly)neuropathy: Secondary | ICD-10-CM | POA: Diagnosis not present

## 2016-08-30 DIAGNOSIS — E782 Mixed hyperlipidemia: Secondary | ICD-10-CM | POA: Diagnosis not present

## 2016-08-30 DIAGNOSIS — E1142 Type 2 diabetes mellitus with diabetic polyneuropathy: Secondary | ICD-10-CM | POA: Diagnosis not present

## 2016-08-30 DIAGNOSIS — I10 Essential (primary) hypertension: Secondary | ICD-10-CM | POA: Diagnosis not present

## 2016-08-30 DIAGNOSIS — K219 Gastro-esophageal reflux disease without esophagitis: Secondary | ICD-10-CM | POA: Diagnosis not present

## 2017-02-27 DIAGNOSIS — E1143 Type 2 diabetes mellitus with diabetic autonomic (poly)neuropathy: Secondary | ICD-10-CM | POA: Diagnosis not present

## 2017-02-27 DIAGNOSIS — I10 Essential (primary) hypertension: Secondary | ICD-10-CM | POA: Diagnosis not present

## 2017-02-27 DIAGNOSIS — E782 Mixed hyperlipidemia: Secondary | ICD-10-CM | POA: Diagnosis not present

## 2017-02-27 DIAGNOSIS — K219 Gastro-esophageal reflux disease without esophagitis: Secondary | ICD-10-CM | POA: Diagnosis not present

## 2017-04-14 DIAGNOSIS — H01021 Squamous blepharitis right upper eyelid: Secondary | ICD-10-CM | POA: Diagnosis not present

## 2017-04-14 DIAGNOSIS — H5202 Hypermetropia, left eye: Secondary | ICD-10-CM | POA: Diagnosis not present

## 2017-04-14 DIAGNOSIS — H2513 Age-related nuclear cataract, bilateral: Secondary | ICD-10-CM | POA: Diagnosis not present

## 2017-04-14 DIAGNOSIS — H01024 Squamous blepharitis left upper eyelid: Secondary | ICD-10-CM | POA: Diagnosis not present

## 2017-04-14 DIAGNOSIS — H52222 Regular astigmatism, left eye: Secondary | ICD-10-CM | POA: Diagnosis not present

## 2017-09-07 DIAGNOSIS — E1143 Type 2 diabetes mellitus with diabetic autonomic (poly)neuropathy: Secondary | ICD-10-CM | POA: Diagnosis not present

## 2017-09-07 DIAGNOSIS — K219 Gastro-esophageal reflux disease without esophagitis: Secondary | ICD-10-CM | POA: Diagnosis not present

## 2017-09-07 DIAGNOSIS — I10 Essential (primary) hypertension: Secondary | ICD-10-CM | POA: Diagnosis not present

## 2017-09-07 DIAGNOSIS — E782 Mixed hyperlipidemia: Secondary | ICD-10-CM | POA: Diagnosis not present

## 2017-10-13 ENCOUNTER — Encounter: Payer: Self-pay | Admitting: Nurse Practitioner

## 2017-10-13 NOTE — Progress Notes (Signed)
This encounter was created in error - please disregard.

## 2018-03-20 DIAGNOSIS — E1142 Type 2 diabetes mellitus with diabetic polyneuropathy: Secondary | ICD-10-CM | POA: Diagnosis not present

## 2018-03-20 DIAGNOSIS — E782 Mixed hyperlipidemia: Secondary | ICD-10-CM | POA: Diagnosis not present

## 2018-03-20 DIAGNOSIS — Z0000 Encounter for general adult medical examination without abnormal findings: Secondary | ICD-10-CM | POA: Diagnosis not present

## 2018-03-20 DIAGNOSIS — I10 Essential (primary) hypertension: Secondary | ICD-10-CM | POA: Diagnosis not present

## 2018-09-21 DIAGNOSIS — E782 Mixed hyperlipidemia: Secondary | ICD-10-CM | POA: Diagnosis not present

## 2018-09-21 DIAGNOSIS — E1142 Type 2 diabetes mellitus with diabetic polyneuropathy: Secondary | ICD-10-CM | POA: Diagnosis not present

## 2018-09-21 DIAGNOSIS — I10 Essential (primary) hypertension: Secondary | ICD-10-CM | POA: Diagnosis not present

## 2018-09-21 DIAGNOSIS — K219 Gastro-esophageal reflux disease without esophagitis: Secondary | ICD-10-CM | POA: Diagnosis not present

## 2018-09-21 DIAGNOSIS — Z23 Encounter for immunization: Secondary | ICD-10-CM | POA: Diagnosis not present

## 2018-10-08 IMAGING — CR DG CHEST 1V PORT
1 series · 1 of 1 positions shown · non-contrast
Comparison: 05/05/2016

CLINICAL DATA: GI bleeding.  Nonproductive cough

EXAM:
PORTABLE CHEST 1 VIEW

[AP]
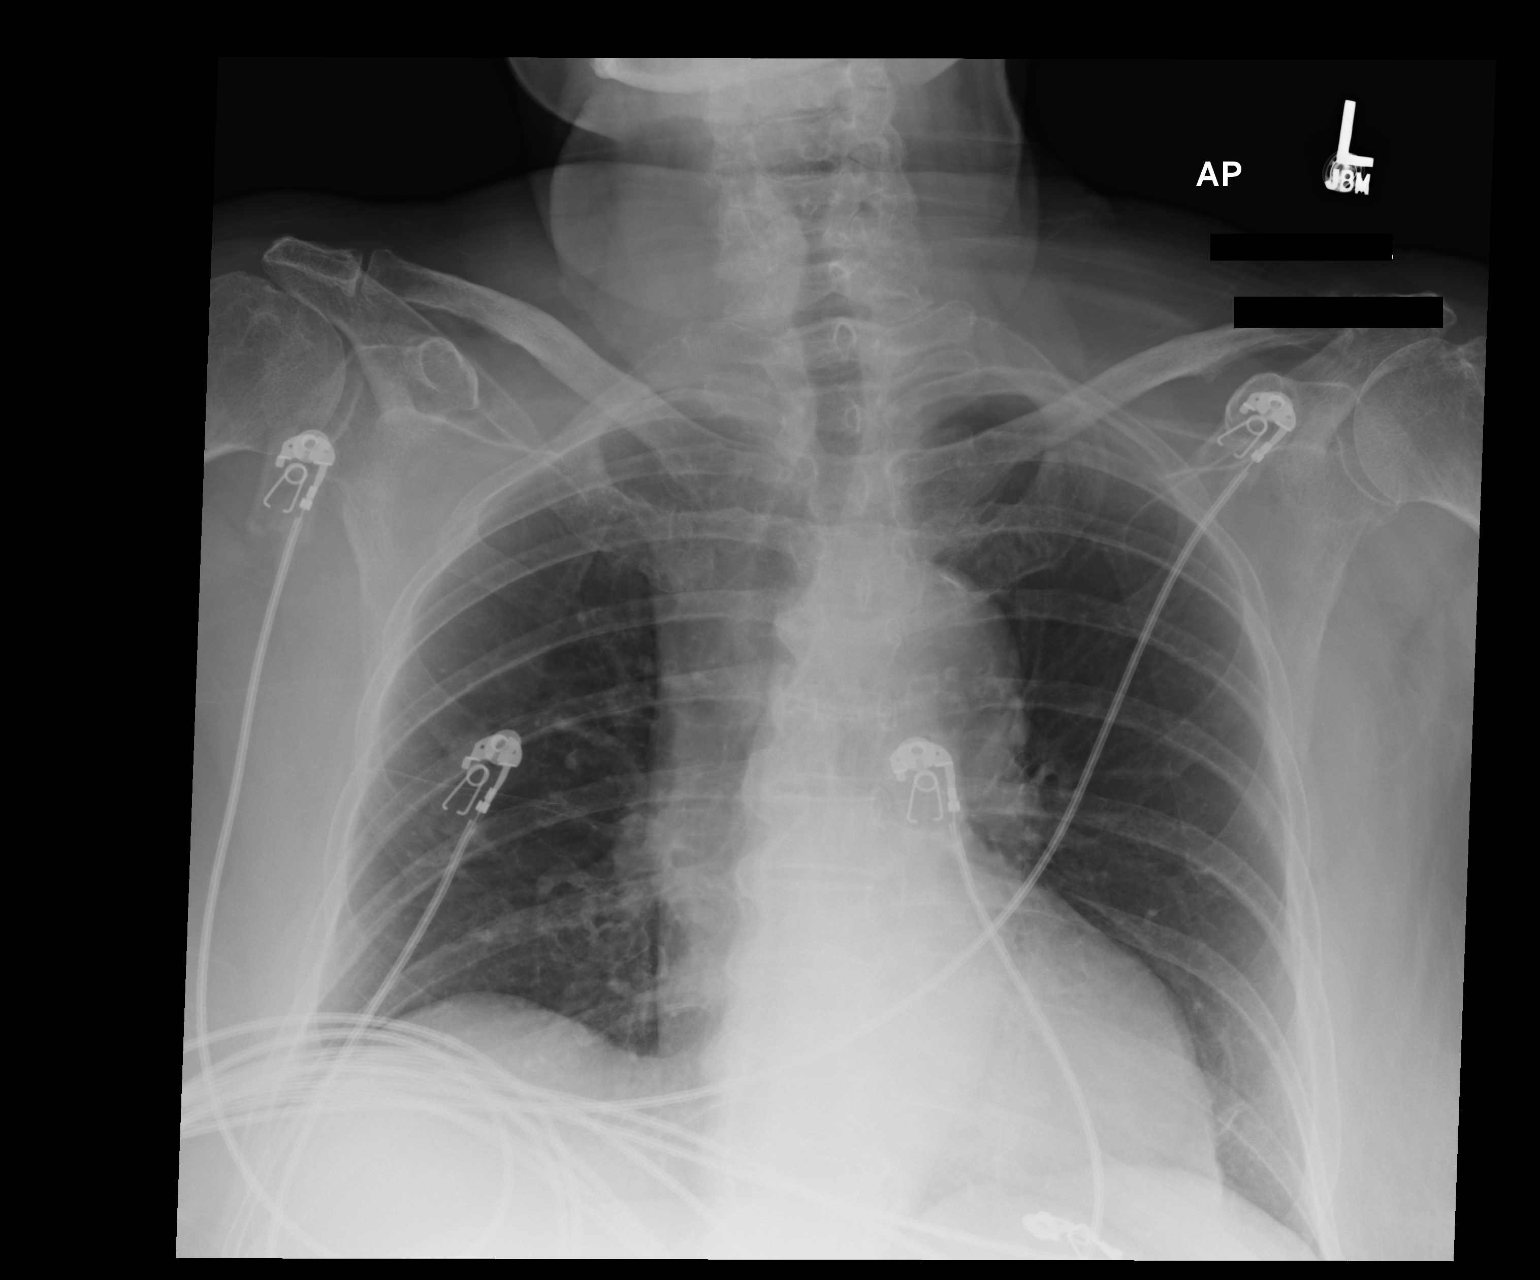

[1 of 1 positions shown; findings below may reference images not displayed]

FINDINGS: Prominent left ventricle. Negative aortic and hilar contours. There
is no edema, consolidation, effusion, or pneumothorax.
IMPRESSION: Stable from prior.  No evidence of acute disease.

## 2018-11-06 DIAGNOSIS — R0602 Shortness of breath: Secondary | ICD-10-CM | POA: Diagnosis not present

## 2018-11-06 DIAGNOSIS — J189 Pneumonia, unspecified organism: Secondary | ICD-10-CM | POA: Diagnosis not present

## 2018-11-06 DIAGNOSIS — Z8673 Personal history of transient ischemic attack (TIA), and cerebral infarction without residual deficits: Secondary | ICD-10-CM | POA: Diagnosis not present

## 2018-11-06 DIAGNOSIS — E7800 Pure hypercholesterolemia, unspecified: Secondary | ICD-10-CM | POA: Diagnosis not present

## 2018-11-06 DIAGNOSIS — R069 Unspecified abnormalities of breathing: Secondary | ICD-10-CM | POA: Diagnosis not present

## 2018-11-06 DIAGNOSIS — R52 Pain, unspecified: Secondary | ICD-10-CM | POA: Diagnosis not present

## 2018-11-06 DIAGNOSIS — I4891 Unspecified atrial fibrillation: Secondary | ICD-10-CM | POA: Diagnosis not present

## 2018-11-06 DIAGNOSIS — Z79899 Other long term (current) drug therapy: Secondary | ICD-10-CM | POA: Diagnosis not present

## 2018-11-06 DIAGNOSIS — J9690 Respiratory failure, unspecified, unspecified whether with hypoxia or hypercapnia: Secondary | ICD-10-CM | POA: Diagnosis not present

## 2018-11-06 DIAGNOSIS — I214 Non-ST elevation (NSTEMI) myocardial infarction: Secondary | ICD-10-CM | POA: Diagnosis not present

## 2018-11-06 DIAGNOSIS — I10 Essential (primary) hypertension: Secondary | ICD-10-CM | POA: Diagnosis not present

## 2018-11-06 DIAGNOSIS — M1990 Unspecified osteoarthritis, unspecified site: Secondary | ICD-10-CM | POA: Diagnosis not present

## 2018-11-06 DIAGNOSIS — J9600 Acute respiratory failure, unspecified whether with hypoxia or hypercapnia: Secondary | ICD-10-CM | POA: Diagnosis not present

## 2018-11-06 DIAGNOSIS — I330 Acute and subacute infective endocarditis: Secondary | ICD-10-CM | POA: Diagnosis not present

## 2018-11-06 DIAGNOSIS — K219 Gastro-esophageal reflux disease without esophagitis: Secondary | ICD-10-CM | POA: Diagnosis not present

## 2018-11-06 DIAGNOSIS — R6520 Severe sepsis without septic shock: Secondary | ICD-10-CM | POA: Diagnosis not present

## 2018-11-06 DIAGNOSIS — I340 Nonrheumatic mitral (valve) insufficiency: Secondary | ICD-10-CM | POA: Diagnosis not present

## 2018-11-06 DIAGNOSIS — Z4682 Encounter for fitting and adjustment of non-vascular catheter: Secondary | ICD-10-CM | POA: Diagnosis not present

## 2018-11-06 DIAGNOSIS — Z9911 Dependence on respirator [ventilator] status: Secondary | ICD-10-CM | POA: Diagnosis not present

## 2018-11-06 DIAGNOSIS — E119 Type 2 diabetes mellitus without complications: Secondary | ICD-10-CM | POA: Diagnosis not present

## 2018-11-06 DIAGNOSIS — A419 Sepsis, unspecified organism: Secondary | ICD-10-CM | POA: Diagnosis not present

## 2018-11-06 DIAGNOSIS — Z452 Encounter for adjustment and management of vascular access device: Secondary | ICD-10-CM | POA: Diagnosis not present

## 2018-11-06 DIAGNOSIS — R6521 Severe sepsis with septic shock: Secondary | ICD-10-CM | POA: Diagnosis not present

## 2018-11-06 DIAGNOSIS — J9601 Acute respiratory failure with hypoxia: Secondary | ICD-10-CM | POA: Diagnosis not present

## 2018-11-06 DIAGNOSIS — J9811 Atelectasis: Secondary | ICD-10-CM | POA: Diagnosis not present

## 2018-11-06 DIAGNOSIS — R7989 Other specified abnormal findings of blood chemistry: Secondary | ICD-10-CM | POA: Diagnosis not present

## 2018-11-06 DIAGNOSIS — R0902 Hypoxemia: Secondary | ICD-10-CM | POA: Diagnosis not present

## 2018-11-06 DIAGNOSIS — R918 Other nonspecific abnormal finding of lung field: Secondary | ICD-10-CM | POA: Diagnosis not present

## 2018-11-06 DIAGNOSIS — Z8589 Personal history of malignant neoplasm of other organs and systems: Secondary | ICD-10-CM | POA: Diagnosis not present

## 2018-11-06 DIAGNOSIS — E872 Acidosis: Secondary | ICD-10-CM | POA: Diagnosis not present

## 2018-11-07 DIAGNOSIS — J9690 Respiratory failure, unspecified, unspecified whether with hypoxia or hypercapnia: Secondary | ICD-10-CM | POA: Diagnosis not present

## 2018-11-07 DIAGNOSIS — A419 Sepsis, unspecified organism: Secondary | ICD-10-CM | POA: Diagnosis not present

## 2018-11-07 DIAGNOSIS — I214 Non-ST elevation (NSTEMI) myocardial infarction: Secondary | ICD-10-CM | POA: Diagnosis not present

## 2018-11-07 DIAGNOSIS — I330 Acute and subacute infective endocarditis: Secondary | ICD-10-CM | POA: Diagnosis not present

## 2018-11-08 DIAGNOSIS — A419 Sepsis, unspecified organism: Secondary | ICD-10-CM | POA: Diagnosis not present

## 2018-11-08 DIAGNOSIS — I330 Acute and subacute infective endocarditis: Secondary | ICD-10-CM | POA: Diagnosis not present

## 2018-11-08 DIAGNOSIS — J9690 Respiratory failure, unspecified, unspecified whether with hypoxia or hypercapnia: Secondary | ICD-10-CM | POA: Diagnosis not present

## 2018-11-08 DIAGNOSIS — I214 Non-ST elevation (NSTEMI) myocardial infarction: Secondary | ICD-10-CM | POA: Diagnosis not present

## 2018-11-09 DIAGNOSIS — I340 Nonrheumatic mitral (valve) insufficiency: Secondary | ICD-10-CM

## 2018-11-09 DIAGNOSIS — I214 Non-ST elevation (NSTEMI) myocardial infarction: Secondary | ICD-10-CM | POA: Diagnosis not present

## 2018-11-09 DIAGNOSIS — A419 Sepsis, unspecified organism: Secondary | ICD-10-CM | POA: Diagnosis not present

## 2018-11-09 DIAGNOSIS — I330 Acute and subacute infective endocarditis: Secondary | ICD-10-CM | POA: Diagnosis not present

## 2018-11-09 DIAGNOSIS — J9690 Respiratory failure, unspecified, unspecified whether with hypoxia or hypercapnia: Secondary | ICD-10-CM | POA: Diagnosis not present

## 2018-11-21 DEATH — deceased
